# Patient Record
Sex: Female | Born: 1940 | Race: White | Hispanic: No | Marital: Married | Smoking: Never smoker
Health system: Southern US, Community
[De-identification: ages and names within clinical notes are randomized; demographics above are authoritative.]

## PROBLEM LIST (undated history)

## (undated) DIAGNOSIS — E119 Type 2 diabetes mellitus without complications: Secondary | ICD-10-CM

## (undated) DIAGNOSIS — I10 Essential (primary) hypertension: Secondary | ICD-10-CM

## (undated) DIAGNOSIS — I4891 Unspecified atrial fibrillation: Secondary | ICD-10-CM

## (undated) HISTORY — PX: EYE SURGERY: SHX253

---

## 2011-08-18 DIAGNOSIS — H2510 Age-related nuclear cataract, unspecified eye: Secondary | ICD-10-CM | POA: Insufficient documentation

## 2011-08-18 DIAGNOSIS — Z961 Presence of intraocular lens: Secondary | ICD-10-CM | POA: Insufficient documentation

## 2011-09-22 DIAGNOSIS — Z9849 Cataract extraction status, unspecified eye: Secondary | ICD-10-CM | POA: Insufficient documentation

## 2012-03-01 ENCOUNTER — Emergency Department: Payer: Self-pay | Admitting: Internal Medicine

## 2012-03-07 ENCOUNTER — Ambulatory Visit: Payer: Self-pay | Admitting: Family Medicine

## 2012-08-01 ENCOUNTER — Ambulatory Visit: Payer: Self-pay | Admitting: Family Medicine

## 2012-08-01 LAB — COMPREHENSIVE METABOLIC PANEL
Albumin: 4.1 g/dL (ref 3.4–5.0)
Alkaline Phosphatase: 68 U/L (ref 50–136)
Anion Gap: 14 (ref 7–16)
BUN: 16 mg/dL (ref 7–18)
Co2: 27 mmol/L (ref 21–32)
EGFR (African American): >60
EGFR (Non-African Amer.): >60
Glucose: 116 mg/dL — ABNORMAL HIGH (ref 65–99)
Osmolality: 282 (ref 275–301)
Potassium: 3.6 mmol/L (ref 3.5–5.1)
SGOT(AST): 30 U/L (ref 15–37)
Sodium: 140 mmol/L (ref 136–145)
Total Protein: 7.9 g/dL (ref 6.4–8.2)

## 2012-08-01 LAB — URINALYSIS, COMPLETE
Bacteria: NEGATIVE
Bilirubin,UR: NEGATIVE
Glucose,UR: NEGATIVE mg/dL (ref 0–75)
Ketone: NEGATIVE
Leukocyte Esterase: NEGATIVE
Nitrite: NEGATIVE
Ph: 7 (ref 4.5–8.0)
Specific Gravity: 1.015 (ref 1.003–1.030)

## 2012-08-01 LAB — CBC WITH DIFFERENTIAL/PLATELET
Basophil #: 0.1 10*3/uL (ref 0.0–0.1)
Eosinophil #: 0 10*3/uL (ref 0.0–0.7)
Eosinophil %: 0.2 %
HGB: 14.3 g/dL (ref 12.0–16.0)
Lymphocyte #: 1.5 10*3/uL (ref 1.0–3.6)
Lymphocyte %: 17.9 %
MCHC: 33.1 g/dL (ref 32.0–36.0)
Neutrophil #: 6.1 10*3/uL (ref 1.4–6.5)
Neutrophil %: 72.1 %
Platelet: 255 10*3/uL (ref 150–440)
RBC: 5.27 10*6/uL — ABNORMAL HIGH (ref 3.80–5.20)
RDW: 16.2 % — ABNORMAL HIGH (ref 11.5–14.5)
WBC: 8.4 10*3/uL (ref 3.6–11.0)

## 2013-06-12 ENCOUNTER — Ambulatory Visit: Payer: Self-pay | Admitting: Emergency Medicine

## 2013-11-08 ENCOUNTER — Ambulatory Visit: Payer: Self-pay | Admitting: Emergency Medicine

## 2014-01-17 ENCOUNTER — Ambulatory Visit: Payer: Self-pay | Admitting: Internal Medicine

## 2014-02-04 ENCOUNTER — Ambulatory Visit: Payer: Self-pay | Admitting: Physician Assistant

## 2014-04-22 DIAGNOSIS — M19049 Primary osteoarthritis, unspecified hand: Secondary | ICD-10-CM | POA: Insufficient documentation

## 2014-08-07 DIAGNOSIS — R251 Tremor, unspecified: Secondary | ICD-10-CM | POA: Insufficient documentation

## 2014-08-07 DIAGNOSIS — I4891 Unspecified atrial fibrillation: Secondary | ICD-10-CM | POA: Insufficient documentation

## 2014-08-07 DIAGNOSIS — Z7901 Long term (current) use of anticoagulants: Secondary | ICD-10-CM | POA: Insufficient documentation

## 2014-08-07 DIAGNOSIS — I10 Essential (primary) hypertension: Secondary | ICD-10-CM | POA: Insufficient documentation

## 2014-12-18 DIAGNOSIS — Z5181 Encounter for therapeutic drug level monitoring: Secondary | ICD-10-CM | POA: Insufficient documentation

## 2014-12-26 DIAGNOSIS — G250 Essential tremor: Secondary | ICD-10-CM | POA: Insufficient documentation

## 2015-04-03 DIAGNOSIS — M25551 Pain in right hip: Secondary | ICD-10-CM | POA: Insufficient documentation

## 2015-07-16 DIAGNOSIS — R7301 Impaired fasting glucose: Secondary | ICD-10-CM | POA: Insufficient documentation

## 2015-08-08 DIAGNOSIS — D122 Benign neoplasm of ascending colon: Secondary | ICD-10-CM | POA: Insufficient documentation

## 2015-08-13 DIAGNOSIS — R791 Abnormal coagulation profile: Secondary | ICD-10-CM | POA: Insufficient documentation

## 2015-12-01 IMAGING — CR DG CHEST 2V
3 series · 3 of 3 positions shown · non-contrast
Comparison: Chest x-ray of 03/18/2009

CLINICAL DATA: Cough for 1 week, fever, chest tightness

EXAM:
CHEST  2 VIEW

[chest pa (1 of 2)]
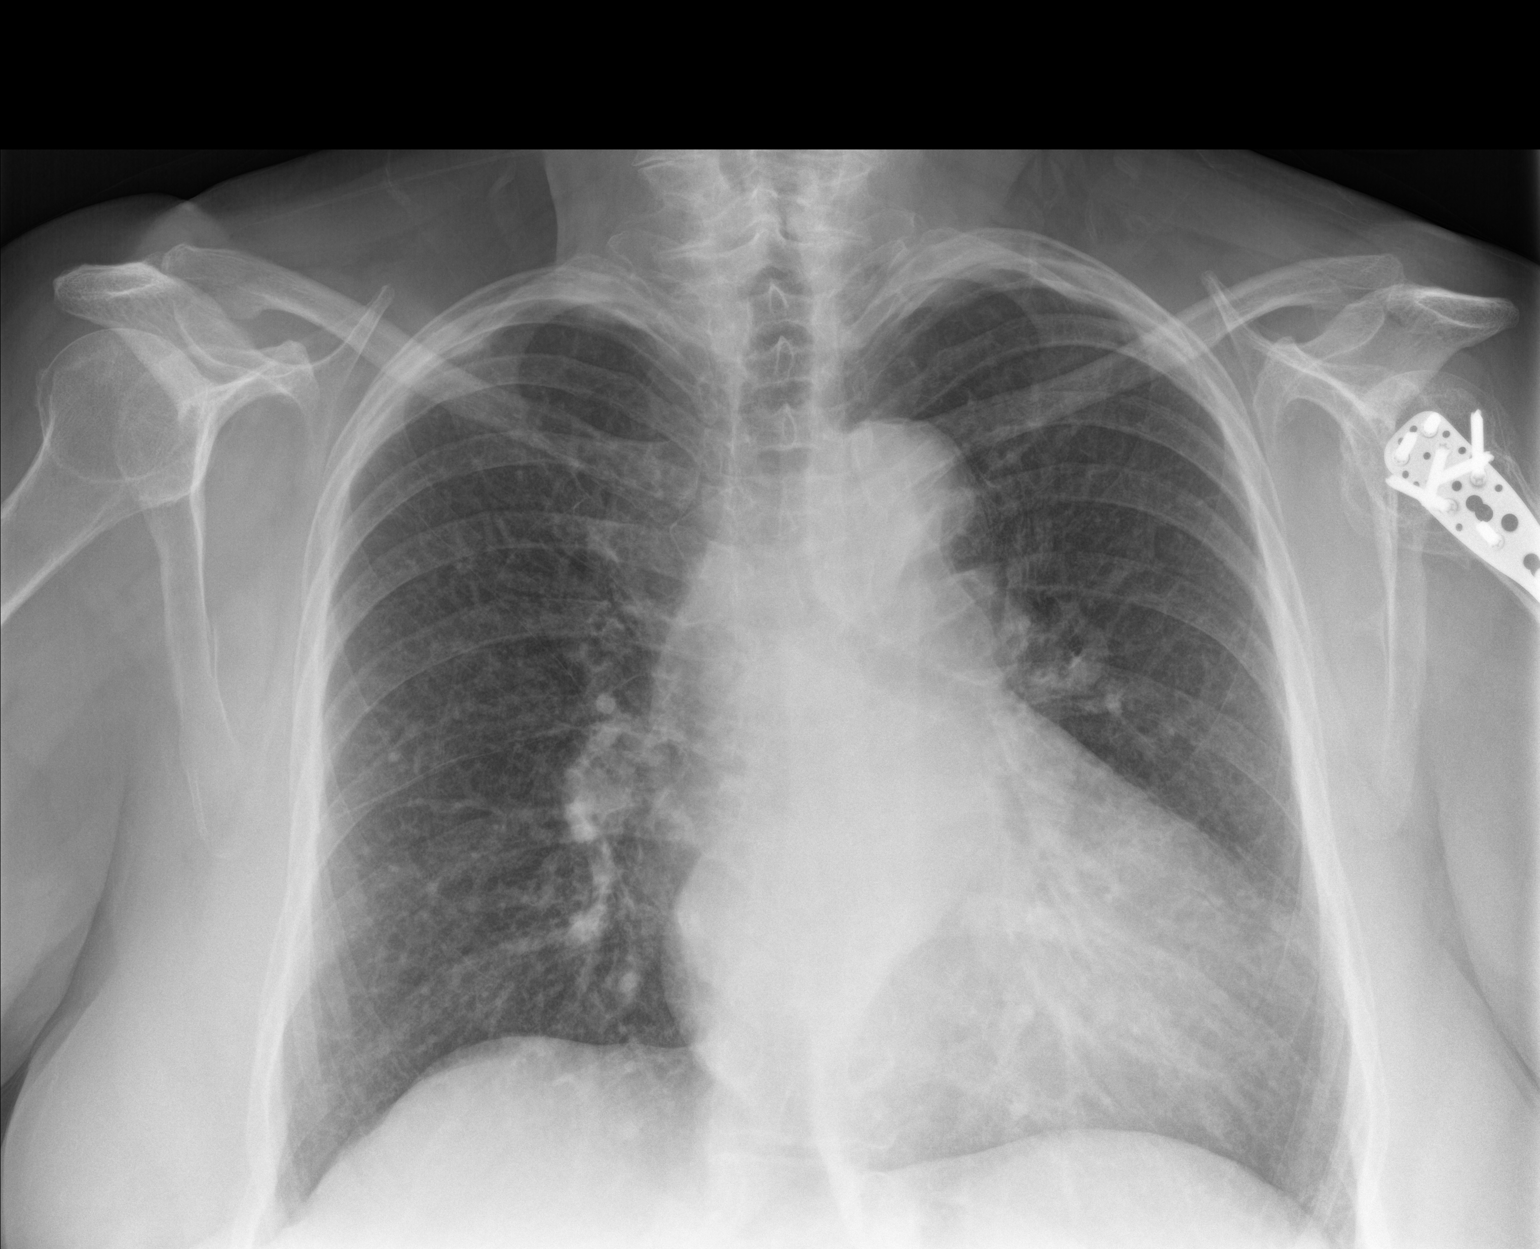

[chest pa (2 of 2)]
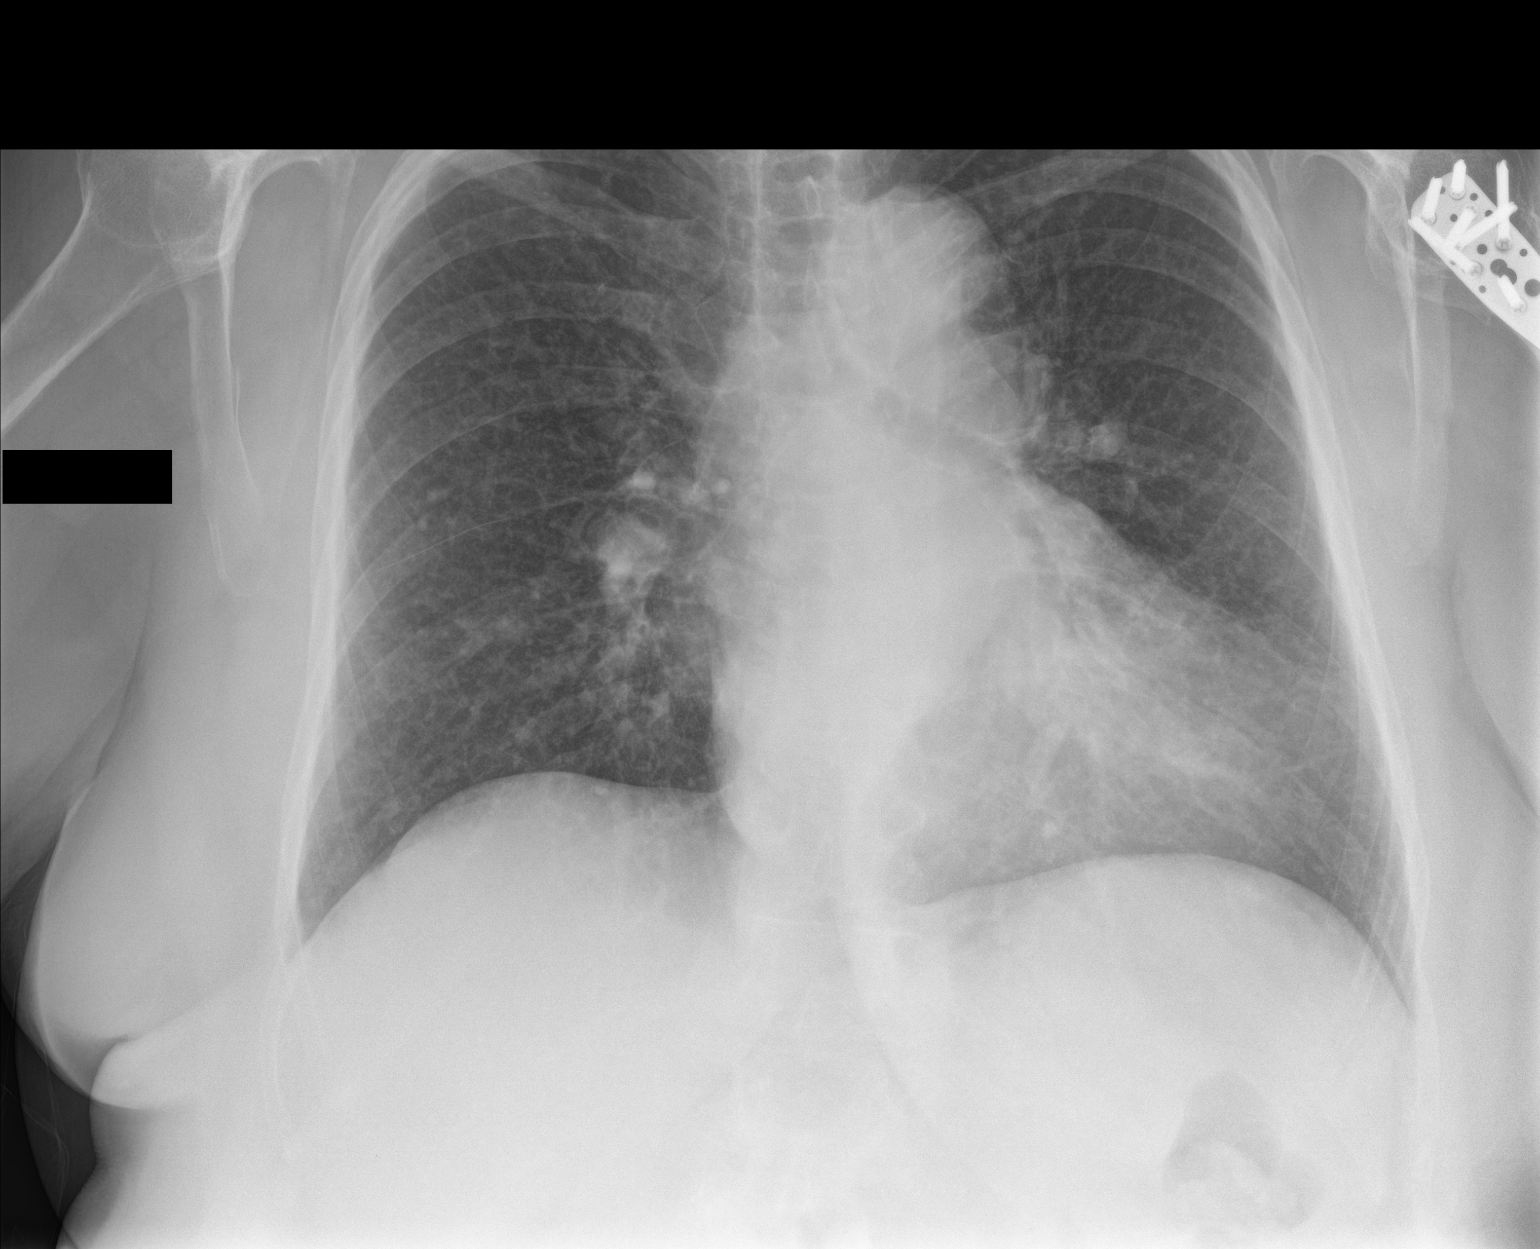

[chest lat]
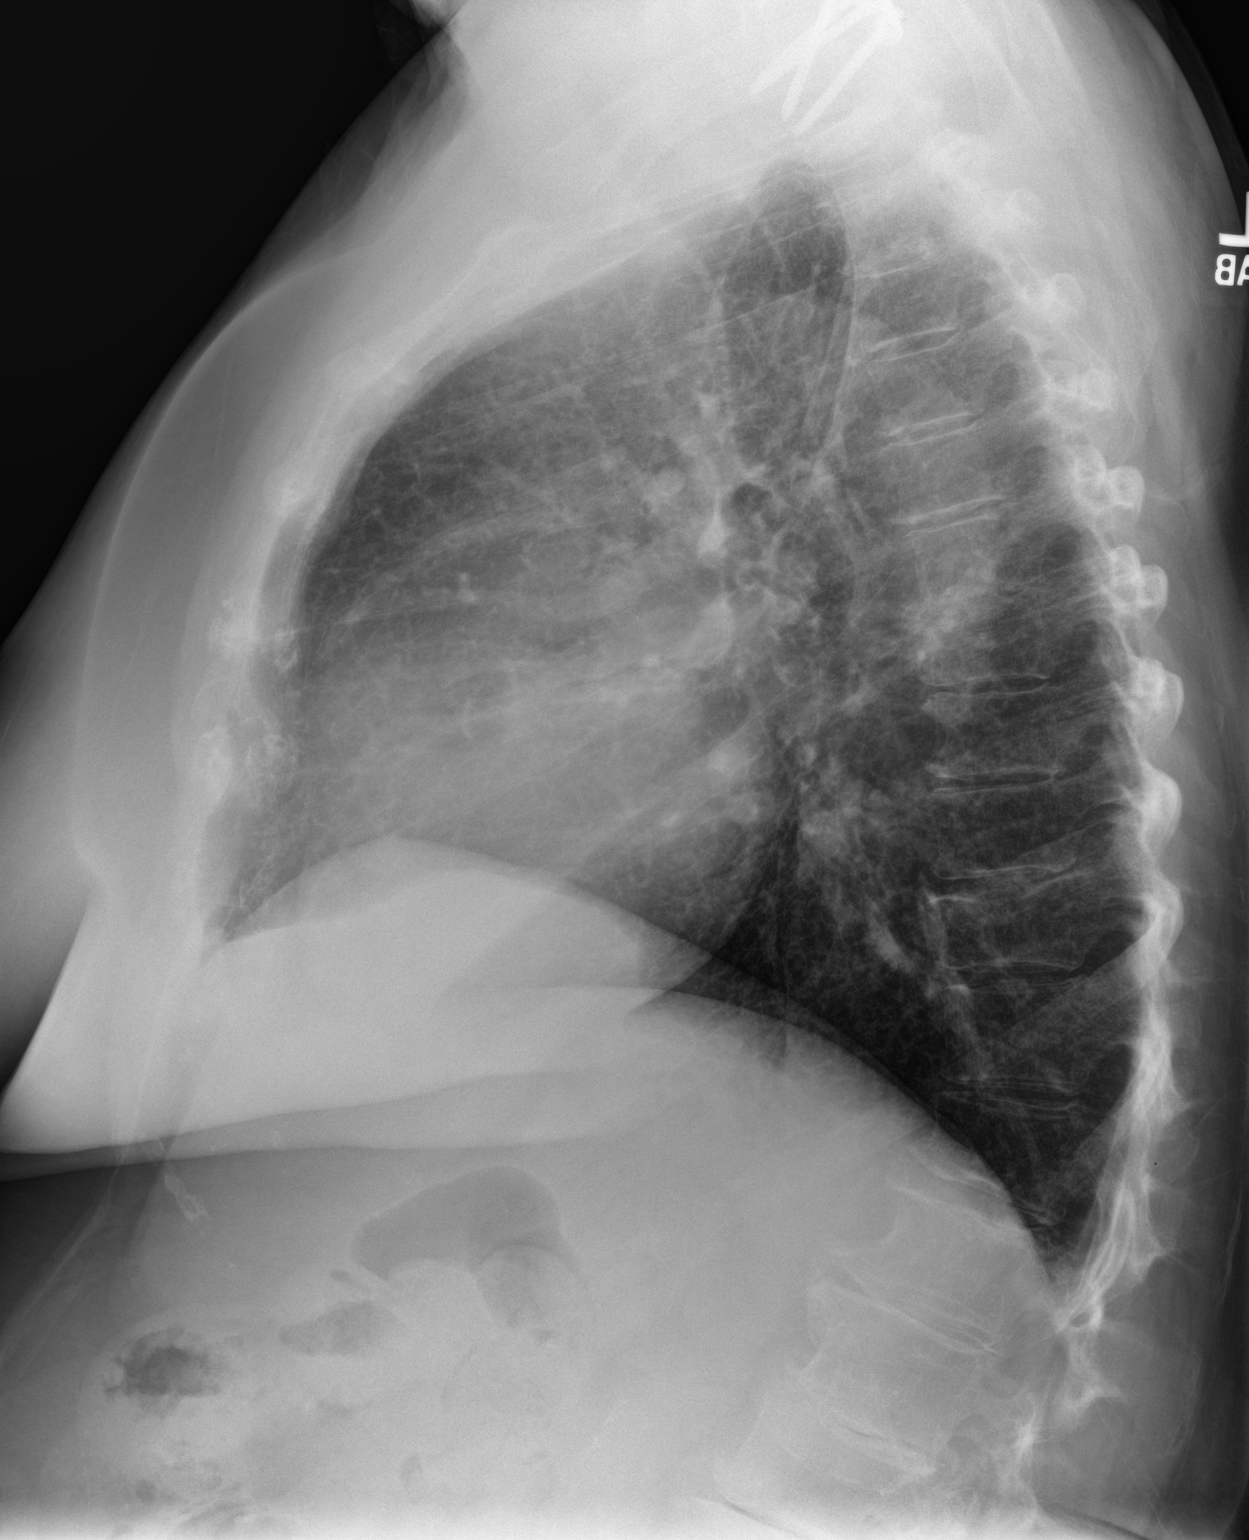

[3 of 3 positions shown; findings below may reference images not displayed]

FINDINGS: There are somewhat prominent interstitial markings diffusely which
are relatively stable. However no definite infiltrate or effusion is
seen. The heart is mildly enlarged and stable. Fixation plate and
screws over the left humeral head neck are noted.
IMPRESSION: Little change in prominent interstitial markings diffusely most
likely chronic in nature. Stable mild cardiomegaly.

## 2015-12-02 DIAGNOSIS — I422 Other hypertrophic cardiomyopathy: Secondary | ICD-10-CM | POA: Insufficient documentation

## 2015-12-02 DIAGNOSIS — G4733 Obstructive sleep apnea (adult) (pediatric): Secondary | ICD-10-CM | POA: Insufficient documentation

## 2016-02-27 IMAGING — CR DG CHEST 2V
2 series · 2 of 2 positions shown · non-contrast
Comparison: 11/08/2013 and 03/18/2009

CLINICAL DATA: Productive cough.  Shortness of breath.

EXAM:
CHEST  2 VIEW

[chest pa]
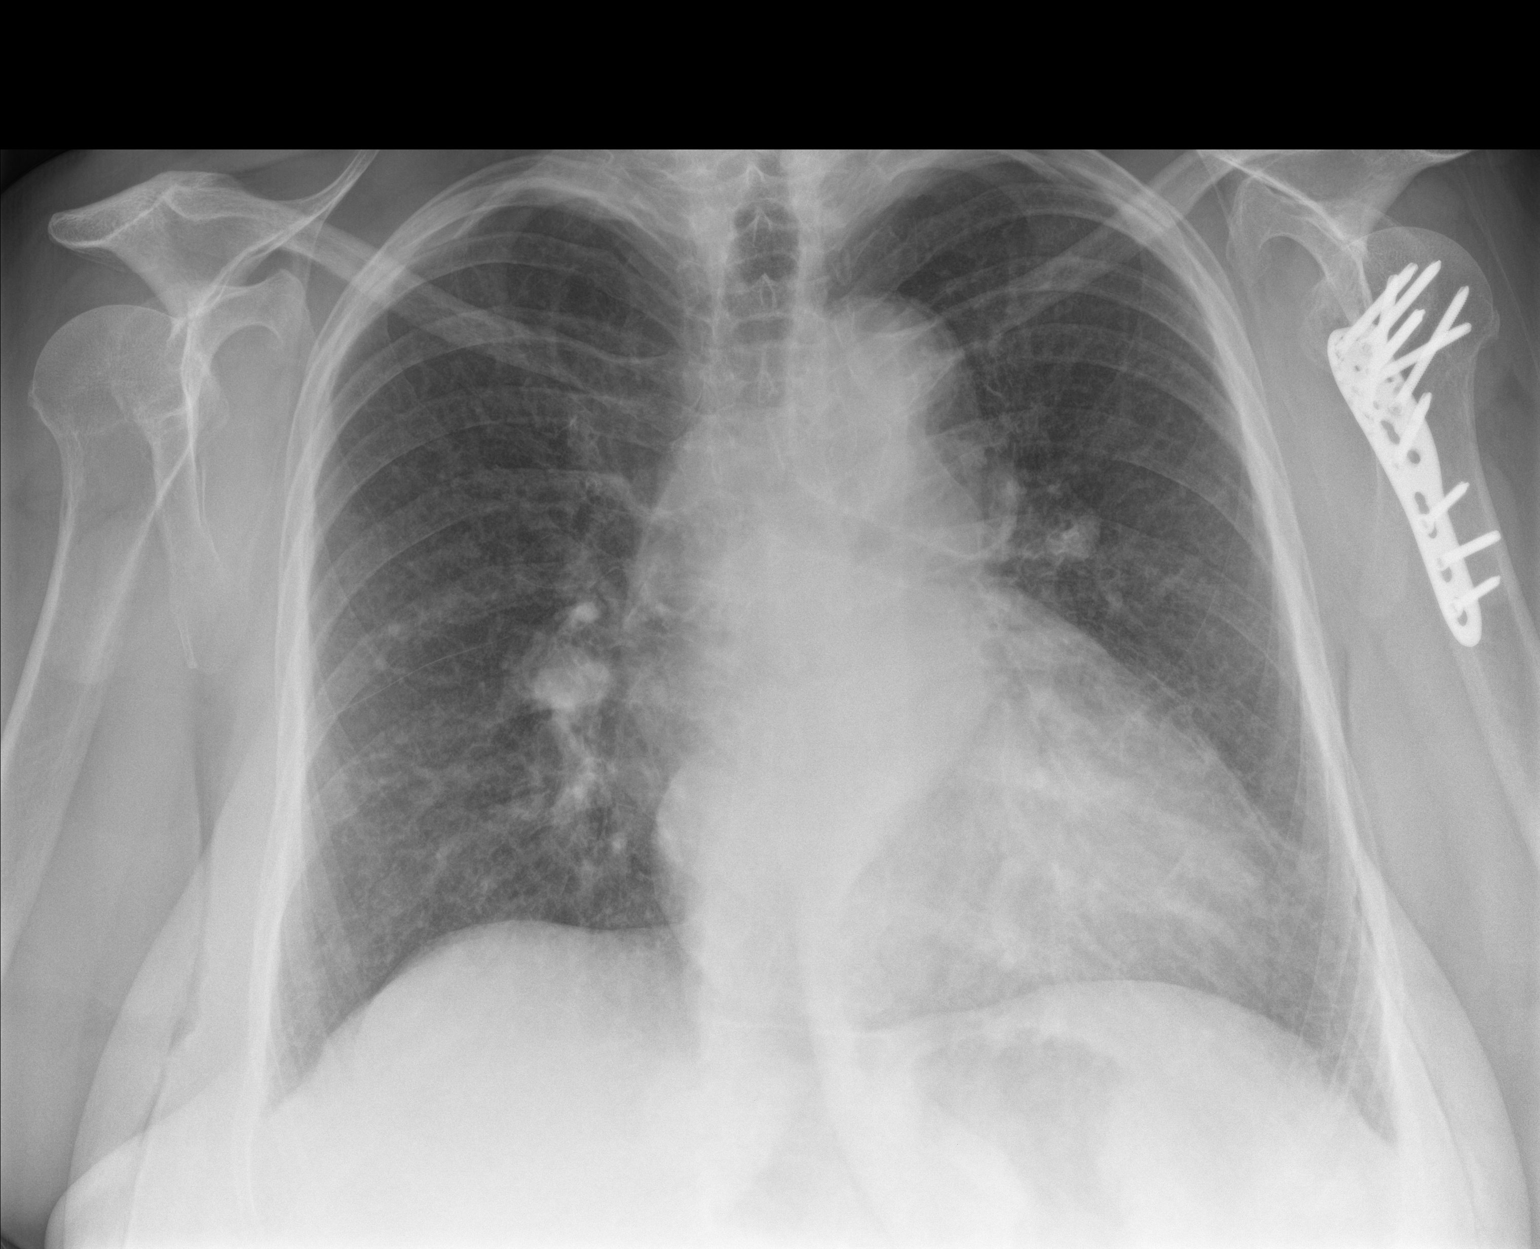

[chest lat]
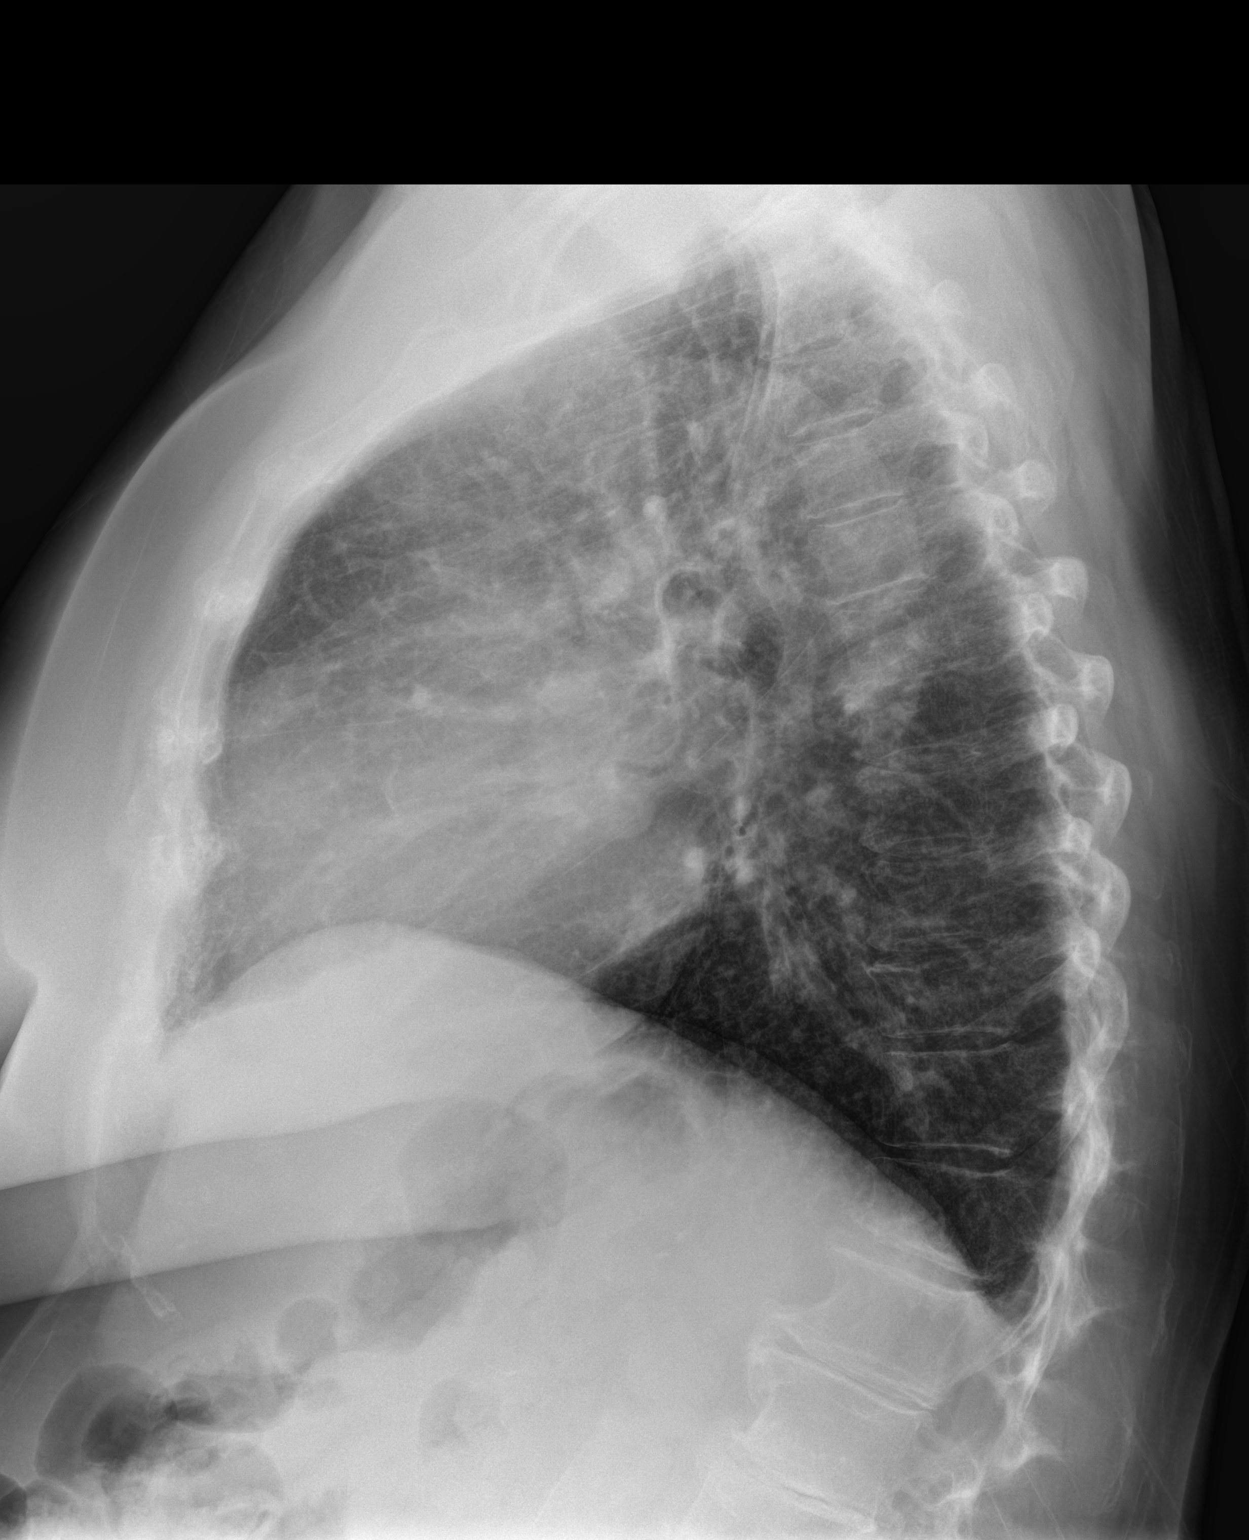

[2 of 2 positions shown; findings below may reference images not displayed]

FINDINGS: There is borderline cardiomegaly with tortuosity of the thoracic
aorta. The pulmonary vascularity is within normal limits. There is
diffuse chronic accentuation of the interstitial markings. No
effusions. No acute osseous abnormality.
IMPRESSION: No acute abnormality. Chronic lung disease. Borderline cardiomegaly.

## 2016-09-05 ENCOUNTER — Emergency Department: Payer: Medicare Other

## 2016-09-05 ENCOUNTER — Emergency Department
Admission: EM | Admit: 2016-09-05 | Discharge: 2016-09-05 | Disposition: A | Discharge status: Home / Self Care | Payer: Medicare Other | Attending: Emergency Medicine | Admitting: Emergency Medicine

## 2016-09-05 DIAGNOSIS — Y999 Unspecified external cause status: Secondary | ICD-10-CM | POA: Diagnosis not present

## 2016-09-05 DIAGNOSIS — X509XXA Other and unspecified overexertion or strenuous movements or postures, initial encounter: Secondary | ICD-10-CM | POA: Diagnosis not present

## 2016-09-05 DIAGNOSIS — E119 Type 2 diabetes mellitus without complications: Secondary | ICD-10-CM | POA: Diagnosis not present

## 2016-09-05 DIAGNOSIS — Y9389 Activity, other specified: Secondary | ICD-10-CM | POA: Insufficient documentation

## 2016-09-05 DIAGNOSIS — M1990 Unspecified osteoarthritis, unspecified site: Secondary | ICD-10-CM | POA: Diagnosis not present

## 2016-09-05 DIAGNOSIS — T148XXA Other injury of unspecified body region, initial encounter: Secondary | ICD-10-CM | POA: Insufficient documentation

## 2016-09-05 DIAGNOSIS — Y9273 Farm field as the place of occurrence of the external cause: Secondary | ICD-10-CM | POA: Diagnosis not present

## 2016-09-05 DIAGNOSIS — M542 Cervicalgia: Secondary | ICD-10-CM | POA: Diagnosis present

## 2016-09-05 DIAGNOSIS — I10 Essential (primary) hypertension: Secondary | ICD-10-CM | POA: Diagnosis not present

## 2016-09-05 HISTORY — DX: Type 2 diabetes mellitus without complications: E11.9

## 2016-09-05 HISTORY — DX: Unspecified atrial fibrillation: I48.91

## 2016-09-05 HISTORY — DX: Essential (primary) hypertension: I10

## 2016-09-05 MED ORDER — LIDOCAINE 5 % EX PTCH
1.0000 | MEDICATED_PATCH | CUTANEOUS | Status: DC
  Administered 2016-09-05: 1 via TRANSDERMAL
  Filled 2016-09-05: qty 1

## 2016-09-05 MED ORDER — TRAMADOL HCL 50 MG PO TABS
50.0000 mg | ORAL_TABLET | Freq: Once | ORAL | Status: AC
  Administered 2016-09-05: 50 mg via ORAL
  Filled 2016-09-05: qty 1

## 2016-09-05 MED ORDER — LIDOCAINE 5 % EX PTCH: 1.0000 | MEDICATED_PATCH | CUTANEOUS | 0 refills | Status: AC

## 2016-09-05 MED ORDER — CYCLOBENZAPRINE HCL 5 MG PO TABS: 5.0000 mg | ORAL_TABLET | Freq: Two times a day (BID) | ORAL | 0 refills | Status: AC

## 2016-09-05 NOTE — ED Provider Notes (Signed)
[facility] Emergency Department Provider Note  ____________________________________________  Time seen: Approximately 5:09 PM  I have reviewed the triage vital signs and the nursing notes.   HISTORY  Chief Complaint neck pain    HPI Lydia Soto is a 76 y.o. female that presents to the emergency department with neck pain after mowing 7 acres yesterday. Pain started today. It is worse on the right than on the left. It does not radiate.  She took a half of a muscle relaxer before arriving. She also placed some lidocaine gel on the back of her neck. No trauma. No headache, shortness of breath, chest pain, numbness, tingling.   Past Medical History:  Diagnosis Date  . A-fib ([place])   . Diabetes mellitus without complication ([place])   . Hypertension     There are no active problems to display for this patient.   History reviewed. No pertinent surgical history.  Prior to Admission medications   Medication Sig Start Date End Date Taking? Authorizing Provider  cyclobenzaprine (FLEXERIL) 5 MG tablet Take 1 tablet (5 mg total) by mouth 2 (two) times daily. 09/05/16 09/12/16  Laban Emperor, PA-C  lidocaine (LIDODERM) 5 % Place 1 patch onto the skin daily. Remove & Discard patch within 12 hours or as directed by MD 09/05/16 09/05/17  Laban Emperor, PA-C    Allergies Phenobarbital and Sulfur  No family history on file.  Social History Social History  Substance Use Topics  . Smoking status: Never Smoker  . Smokeless tobacco: Not on file  . Alcohol use No     Review of Systems  Cardiovascular: No chest pain. Respiratory: No SOB. Gastrointestinal: No abdominal pain.  No nausea, no vomiting.  Musculoskeletal: Positive for neck pain. Skin: Negative for rash, abrasions, lacerations, ecchymosis. Neurological: Negative for headaches, numbness or tingling   ____________________________________________   PHYSICAL EXAM:  VITAL SIGNS: ED Triage Vitals  Enc  Vitals Group     BP 09/05/16 1543 (!) 163/111     Pulse Rate 09/05/16 1543 81     Resp 09/05/16 1543 18     Temp 09/05/16 1543 99.4 F (37.4 C)     Temp Source 09/05/16 1543 Oral     SpO2 09/05/16 1543 94 %     Weight 09/05/16 1543 220 lb (99.8 kg)     Height 09/05/16 1543 5\' 4"  (1.626 m)     Head Circumference --      Peak Flow --      Pain Score 09/05/16 1542 8     Pain Loc --      Pain Edu? --      Excl. in [place]? --      Constitutional: Alert and oriented. Well appearing and in no acute distress. Eyes: Conjunctivae are normal. PERRL. EOMI. Head: Atraumatic. ENT:      Ears:      Nose: No congestion/rhinnorhea.      Mouth/Throat: Mucous membranes are moist.  Neck: No stridor. Tenderness to palpation over inferior cervical spine. Tenderness to palpation over trapezius. Pain worse on the right than the left. Limited range of motion of neck. Cardiovascular: Normal rate, regular rhythm.  Good peripheral circulation. Respiratory: Normal respiratory effort without tachypnea or retractions. Lungs CTAB. Good air entry to the bases with no decreased or absent breath sounds. Musculoskeletal: Full range of motion to all extremities. No gross deformities appreciated. Neurologic:  Normal speech and language. No gross focal neurologic deficits are appreciated.  Skin:  Skin is warm, dry  and intact. No rash noted.   ____________________________________________   LABS (all labs ordered are listed, but only abnormal results are displayed)  Labs Reviewed - No data to display ____________________________________________  EKG   ____________________________________________  RADIOLOGY Robinette Haines, personally viewed and evaluated these images (plain radiographs) as part of my medical decision making, as well as reviewing the written report by the radiologist.  Dg Cervical Spine 2-3 Views  Result Date: 09/05/2016 CLINICAL DATA:  Severe neck pain. EXAM: CERVICAL SPINE - 2-3 VIEW  COMPARISON:  None. FINDINGS: No definite evidence of acute fracture. Osteopenia. 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 These include disc space narrowing, endplate sclerosis, and osteophyte formation. Vertebral body remodeling with ankylosing changes seen at C4-C5. Mild anterolisthesis of C5 on C6, likely degenerative. IMPRESSION: No definite evidence of acute fracture. Severe multilevel osteoarthritic changes. Electronically Signed   By: Fidela Salisbury M.D.   On: 09/05/2016 17:49    ____________________________________________    PROCEDURES  Procedure(s) performed:    Procedures    Medications  traMADol (ULTRAM) tablet 50 mg (50 mg Oral Given 09/05/16 1804)     ____________________________________________   INITIAL IMPRESSION / ASSESSMENT AND PLAN / ED COURSE  Pertinent labs & imaging results that were available during my care of the patient were reviewed by me and considered in my medical decision making (see chart for details).  Review of the [place] CSRS was performed in accordance of the [place] prior to dispensing any controlled drugs.   Patient presented to the emergency department for evaluation of neck pain. Vital signs and exam are reassuring. X-ray negative for acute bony abnormalities. Pain is likely musculoskeletal. After tramadol and Lidoderm, range of motion and pain improved. Patient will be discharged home with prescriptions for a low dose of Flexeril and Lidoderm patches. Patient is to follow up with PCP as directed. Patient is given ED precautions to return to the ED for any worsening or new symptoms.     ____________________________________________  FINAL CLINICAL IMPRESSION(S) / ED DIAGNOSES  Final diagnoses:  Osteoarthritis, unspecified osteoarthritis type, unspecified site  Muscle strain      NEW MEDICATIONS STARTED DURING THIS VISIT:  Discharge Medication List as of 09/05/2016   6:50 PM    START taking these medications   Details  cyclobenzaprine (FLEXERIL) 5 MG tablet Take 1 tablet (5 mg total) by mouth 2 (two) times daily., Starting Sun 09/05/2016, Until Sun 09/12/2016, Print    lidocaine (LIDODERM) 5 % Place 1 patch onto the skin daily. Remove & Discard patch within 12 hours or as directed by MD, Starting Sun 09/05/2016, Until Mon 09/05/2017, Print            This chart was dictated using voice recognition software/Dragon. Despite best efforts to proofread, errors can occur which can change the meaning. Any change was purely unintentional.    Laban Emperor, PA-C 09/05/16 2330    Arta Silence, MD 09/06/16 [phone]

## 2016-09-05 NOTE — ED Triage Notes (Signed)
Pt reports that she mowed over 7 acres yesterday and now is having neck pain. No known injury. Placed in c collar upon arrival. Pt ambulates with ease.

## 2016-11-02 DIAGNOSIS — F418 Other specified anxiety disorders: Secondary | ICD-10-CM

## 2017-01-10 ENCOUNTER — Ambulatory Visit
Admission: EM | Admit: 2017-01-10 | Discharge: 2017-01-10 | Disposition: A | Discharge status: Home / Self Care | Payer: Medicare Other | Attending: Family Medicine | Admitting: Family Medicine

## 2017-01-10 DIAGNOSIS — H6123 Impacted cerumen, bilateral: Secondary | ICD-10-CM

## 2017-01-10 DIAGNOSIS — M47812 Spondylosis without myelopathy or radiculopathy, cervical region: Secondary | ICD-10-CM | POA: Diagnosis not present

## 2017-01-10 MED ORDER — PREDNISONE 10 MG PO TABS: ORAL_TABLET | ORAL | 0 refills | Status: AC

## 2017-01-10 MED ORDER — TIZANIDINE HCL 2 MG PO CAPS: 2.0000 mg | ORAL_CAPSULE | Freq: Three times a day (TID) | ORAL | 0 refills | Status: AC

## 2017-01-10 NOTE — ED Provider Notes (Signed)
[facility]    CSN: [id number] Arrival date & time: 01/10/17  0803     History   Chief Complaint Chief Complaint  Patient presents with  . Neck Pain  . Otalgia    HPI Lydia Soto is a 76 y.o. female.   HPI  This is a 76 year old female who presents with left ear pain for 3 weeks has bled and is sore to the touch around the back of the ear.  She is also has neck pain with radiation into both upper extremities involving the ring and little fingers initially but now with persistent tingling of her right little finger.  She has been seen at the emergency room at [facility] where x-rays showed severe spondylitis of the cervical spine.  She has great deal of anxiety which she is states has exacerbating effect on the neck.  At the present time she has limited range of motion and "knots" in the back of her neck that are very tender.  She has been taking a muscle relaxers which sounds like Flexeril the she did not bring her medicines with her.  Is a history of chronic atrial fibrillation and is on warfarin therapy.        Past Medical History:  Diagnosis Date  . A-fib ([place])   . Diabetes mellitus without complication ([place])   . Hypertension     There are no active problems to display for this patient.   History reviewed. No pertinent surgical history.  OB History    No data available       Home Medications    Prior to Admission medications   Medication Sig Start Date End Date Taking? Authorizing Provider  ALPRAZolam Duanne Moron) 0.5 MG tablet Take 0.5 mg by mouth at bedtime as needed for anxiety.   Yes [provider]  FLUoxetine (PROZAC) 20 MG tablet Take 20 mg by mouth daily.   Yes [provider]  hydrochlorothiazide (HYDRODIURIL) 25 MG tablet Take 25 mg by mouth daily.   Yes [provider]  metoprolol tartrate (LOPRESSOR) 50 MG tablet Take 50 mg by mouth 2 (two) times daily.   Yes [provider]  Multiple Vitamin (MULTIVITAMIN)  tablet Take 1 tablet by mouth daily.   Yes [provider]  pantoprazole (PROTONIX) 40 MG tablet Take 40 mg by mouth daily.   Yes [provider]  potassium chloride (K-DUR) 10 MEQ tablet Take 10 mEq by mouth daily.   Yes [provider]  warfarin (COUMADIN) 5 MG tablet Take 5 mg by mouth daily.   Yes [provider]  lidocaine (LIDODERM) 5 % Place 1 patch onto the skin daily. Remove & Discard patch within 12 hours or as directed by MD 09/05/16 09/05/17  Laban Emperor, PA-C  predniSONE (DELTASONE) 10 MG tablet Take 2 tablets on day 1 and 1 tablet daily for the next 4 days 01/10/17   Lorin Picket, PA-C  tizanidine (ZANAFLEX) 2 MG capsule Take 1 capsule (2 mg total) by mouth 3 (three) times daily. 01/10/17   Lorin Picket, PA-C    Family History No family history on file.  Social History Social History   Tobacco Use  . Smoking status: Never Smoker  . Smokeless tobacco: Never Used  Substance Use Topics  . Alcohol use: No  . Drug use: Not on file     Allergies   Phenobarbital and Sulfur   Review of Systems Review of Systems  Constitutional: Positive for activity change. Negative  for chills, fatigue and fever.  Musculoskeletal: Positive for myalgias, neck pain and neck stiffness.  All other systems reviewed and are negative.    Physical Exam Triage Vital Signs ED Triage Vitals  Enc Vitals Group     BP 01/10/17 0813 (!) 155/102     Pulse Rate 01/10/17 0813 80     Resp 01/10/17 0813 18     Temp 01/10/17 0813 98 F (36.7 C)     Temp Source 01/10/17 0813 Oral     SpO2 01/10/17 0813 95 %     Weight 01/10/17 0818 220 lb (99.8 kg)     Height --      Head Circumference --      Peak Flow --      Pain Score 01/10/17 0818 4     Pain Loc --      Pain Edu? --      Excl. in [place]? --    No data found.  Updated Vital Signs BP (!) 155/102 (BP Location: Left Arm)   Pulse 80   Temp 98 F (36.7 C) (Oral)   Resp 18   Wt 220 lb (99.8 kg)    SpO2 95%   BMI 37.76 kg/m   Visual Acuity Right Eye Distance:   Left Eye Distance:   Bilateral Distance:    Right Eye Near:   Left Eye Near:    Bilateral Near:     Physical Exam  Constitutional: She is oriented to person, place, and time. She appears well-developed and well-nourished. No distress.  HENT:  Head: Normocephalic.  Nose: Nose normal.  Mouth/Throat: Oropharynx is clear and moist. No oropharyngeal exudate.  Examination of the both ears shows occlusion with cerumen.  There is no discomfort over the mastoid or with the movement of the tragus.  There is no evidence of blood or discharge in the left ear.  Eyes: Pupils are equal, round, and reactive to light. Right eye exhibits no discharge. Left eye exhibits no discharge.  Neck: Normal range of motion. Neck supple.  Musculoskeletal: She exhibits tenderness.  Examination of the cervical spine shows limitation of motion in all planes.  Upper extremity strength and sensation are intact except for the right little finger showing hypnesthesia in comparison to the left.  Does have tenderness and palpable muscle spasm of the paraspinous muscles particularly in the lower segments in the trapezius  Lymphadenopathy:    She has no cervical adenopathy.  Neurological: She is alert and oriented to person, place, and time.  Skin: Skin is warm and dry. She is not diaphoretic.  Psychiatric: She has a normal mood and affect. Her behavior is normal. Judgment and thought content normal.  Nursing note and vitals reviewed.    UC Treatments / Results  Labs (all labs ordered are listed, but only abnormal results are displayed) Labs Reviewed - No data to display  EKG  EKG Interpretation None       Radiology No results found.  Procedures Procedures (including critical care time)  Medications Ordered in UC Medications - No data to display   Initial Impression / Assessment and Plan / UC Course  I have reviewed the triage vital  signs and the nursing notes.  Pertinent labs & imaging results that were available during my care of the patient were reviewed by me and considered in my medical decision making (see chart for details).     Plan: 1. Test/x-ray results and diagnosis reviewed with patient 2. rx as per orders; risks,  benefits, potential side effects reviewed with patient 3. Recommend supportive treatment with rest and symptom avoidance.  Difficult in treating the patient due to her warfarin therapy.  According to her explanation , her INR is difficult to control. she has significant pain today.  Therefore I will place her on a short course of low-dose prednisone also recommended topical medication to help with her pain and help relieve spasm.  I will send her back to her primary care physician for further evaluation and possible referral to spinal surgeon for their opinion.  Also while at the primary care's office irrigation for her ears for the impaction will provide her with some benefit.  Cautioned her regarding the use of the muscle relaxer with her history of falls.  Provide her with a very low dose still caution her about its use. 4. F/u prn if symptoms worsen or don't improve   Final Clinical Impressions(s) / UC Diagnoses   Final diagnoses:  Cervical spondylosis without myelopathy  Bilateral impacted cerumen    ED Discharge Orders        Ordered    tizanidine (ZANAFLEX) 2 MG capsule  3 times daily     01/10/17 0855    predniSONE (DELTASONE) 10 MG tablet     01/10/17 0855       Controlled Substance Prescriptions [place] Controlled Substance Registry consulted? Not Applicable   Lorin Picket, PA-C 01/10/17 0881

## 2017-01-10 NOTE — Discharge Instructions (Signed)
Contact the person managing your warfarin to notify them of the prednisone you are taking.  Extreme caution while taking the muscle relaxer Zanaflex.  Drive or perform activities requiring concentration or judgment.  The medication may cause unsteadiness in ambulation.  Recommend following up with your primary care physician regarding your severe cervical arthritis.

## 2017-01-10 NOTE — ED Triage Notes (Signed)
Pt here for left ear pain for 3 weeks and said it was bleeding and sore to the touch around the back of her ear. Also has neck pain and was told she has a hx of arthritis in her neck and said she is unable to turn her head. Also states something may be in her ear that's what it feels like. Has been taking her muscle relaxer for this which she got at [facility] but ran out and unsure the name of it and isn't on the med list.

## 2018-03-06 DIAGNOSIS — M79675 Pain in left toe(s): Secondary | ICD-10-CM | POA: Insufficient documentation

## 2018-03-06 DIAGNOSIS — M2062 Acquired deformities of toe(s), unspecified, left foot: Secondary | ICD-10-CM | POA: Insufficient documentation

## 2018-04-07 DIAGNOSIS — H3562 Retinal hemorrhage, left eye: Secondary | ICD-10-CM | POA: Insufficient documentation

## 2018-09-07 DIAGNOSIS — H26493 Other secondary cataract, bilateral: Secondary | ICD-10-CM | POA: Insufficient documentation

## 2018-09-28 IMAGING — CR DG CERVICAL SPINE 2 OR 3 VIEWS
1 series · 6 of 6 positions shown · non-contrast
Comparison: None.

CLINICAL DATA: Severe neck pain.

EXAM:
CERVICAL SPINE - 2-3 VIEW

[Series 1: dg cervical spine 2 or 3 views · 0.14mm/px · 6 of 6 slices shown]
[im 1/6]
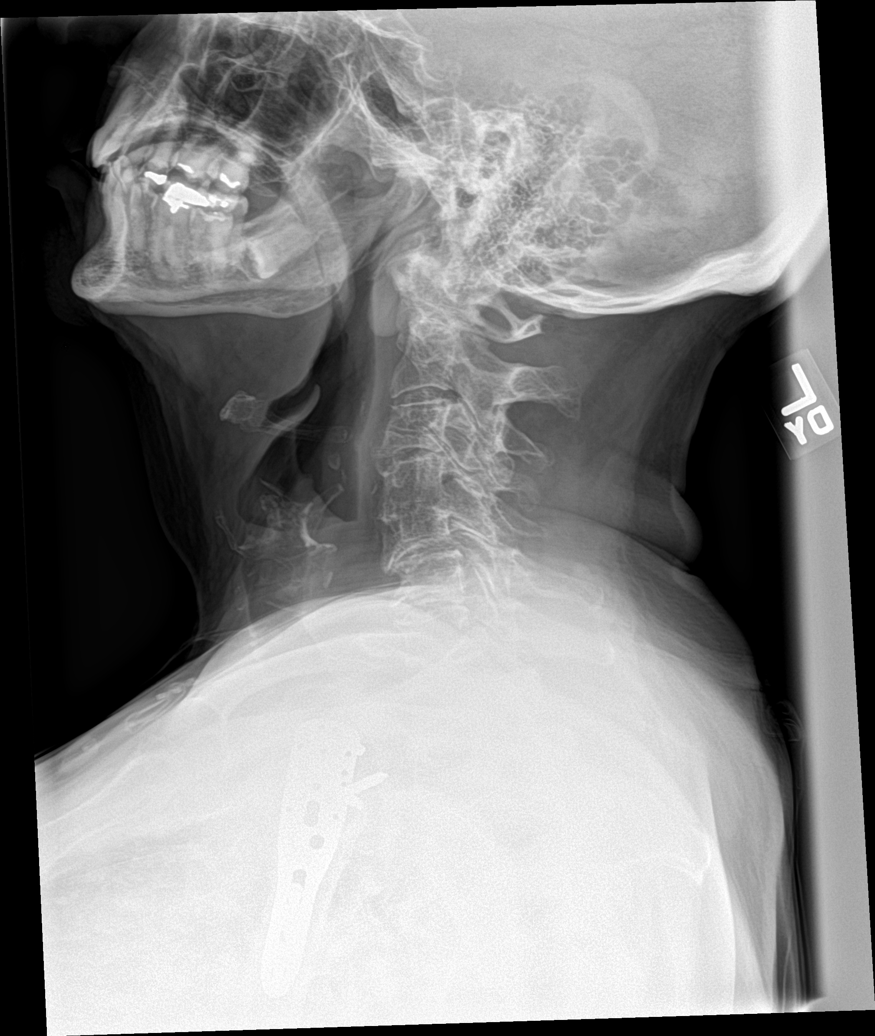
[im 2/6]
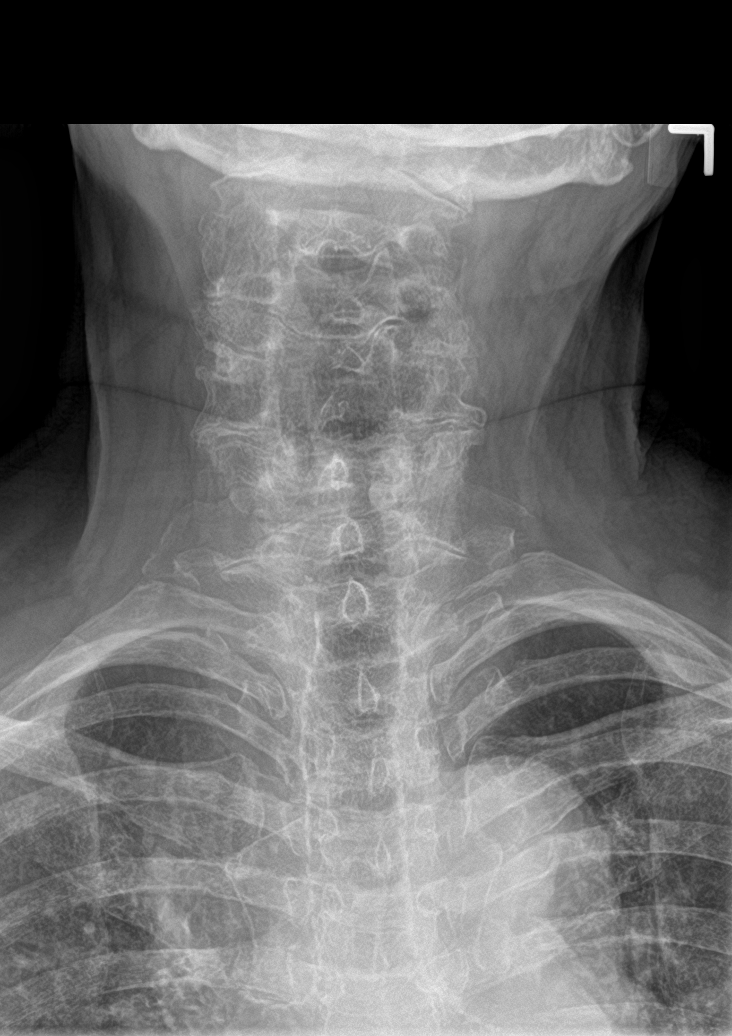
[im 3/6]
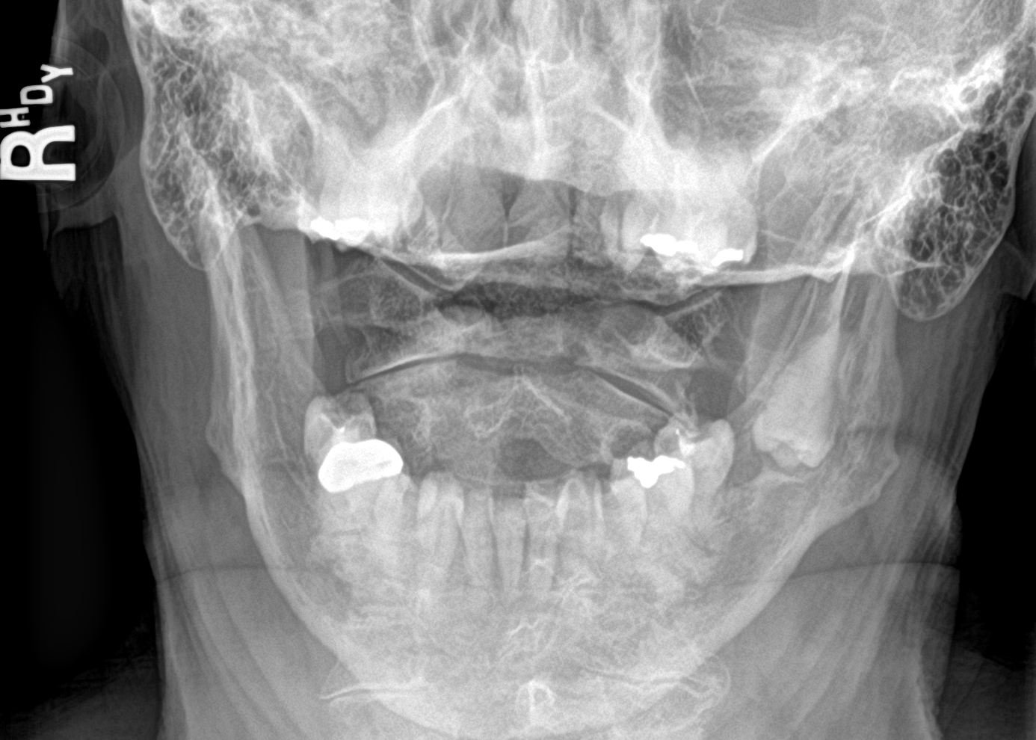
[im 4/6]
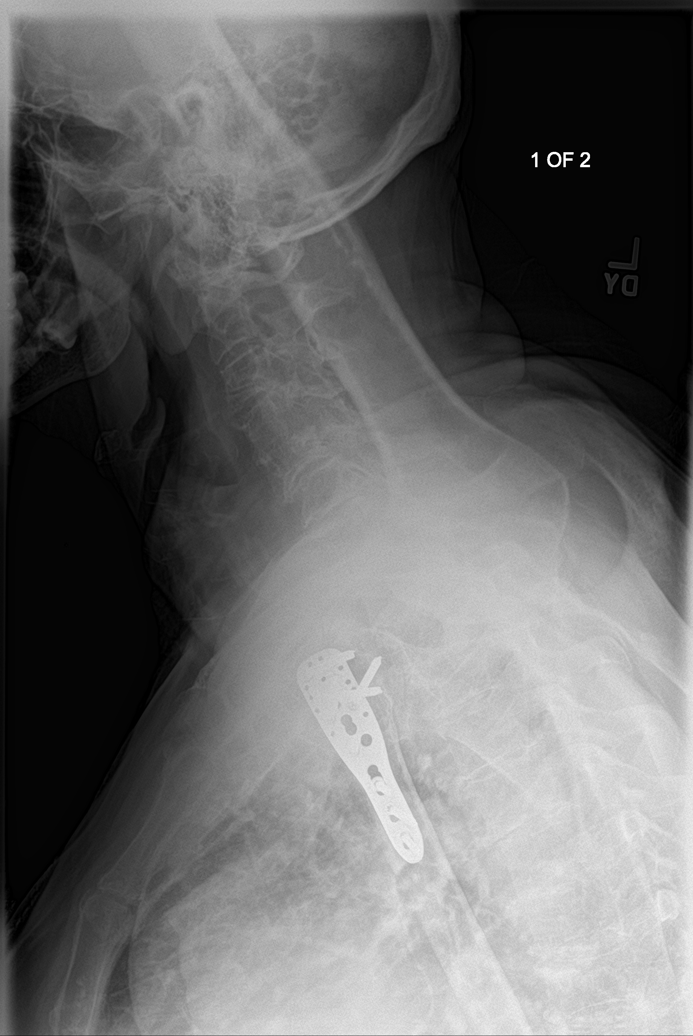
[im 5/6]
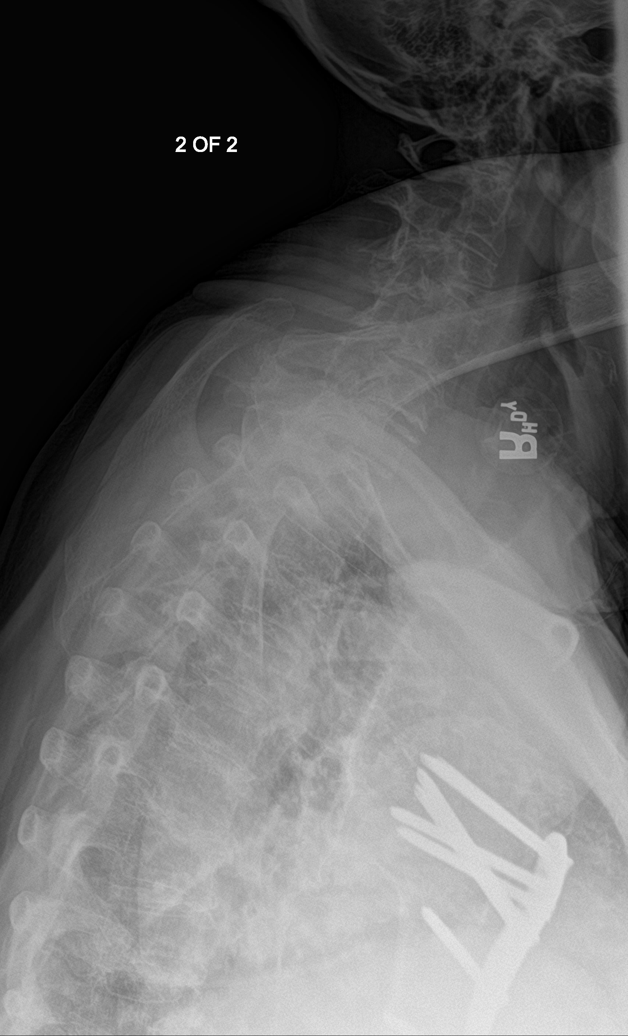
[im 6/6]
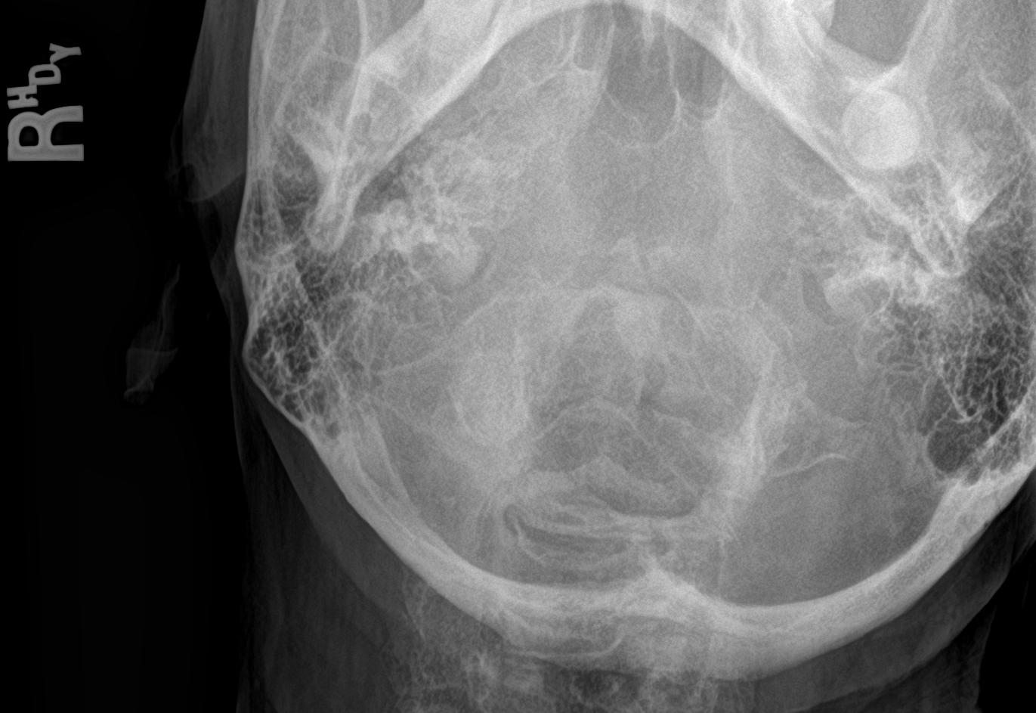

[6 of 6 positions shown; findings below may reference images not displayed]

FINDINGS: No definite evidence of acute fracture. Osteopenia. Severe
multilevel osteoarthritic changes of the cervical spine with
associated posterior facet arthropathy. These include disc space
narrowing, endplate sclerosis, and osteophyte formation. Vertebral
body remodeling with ankylosing changes seen at C4-C5. Mild
anterolisthesis of C5 on C6, likely degenerative.
IMPRESSION: No definite evidence of acute fracture.

Severe multilevel osteoarthritic changes.

## 2018-10-20 ENCOUNTER — Emergency Department
Admission: EM | Admit: 2018-10-20 | Discharge: 2018-10-20 | Disposition: A | Discharge status: Home / Self Care | Payer: Medicare Other | Attending: Emergency Medicine | Admitting: Emergency Medicine

## 2018-10-20 ENCOUNTER — Other Ambulatory Visit: Payer: Self-pay

## 2018-10-20 DIAGNOSIS — F4322 Adjustment disorder with anxiety: Secondary | ICD-10-CM

## 2018-10-20 DIAGNOSIS — Z79899 Other long term (current) drug therapy: Secondary | ICD-10-CM | POA: Diagnosis not present

## 2018-10-20 DIAGNOSIS — Z7901 Long term (current) use of anticoagulants: Secondary | ICD-10-CM | POA: Diagnosis not present

## 2018-10-20 DIAGNOSIS — F410 Panic disorder [episodic paroxysmal anxiety] without agoraphobia: Secondary | ICD-10-CM

## 2018-10-20 DIAGNOSIS — I10 Essential (primary) hypertension: Secondary | ICD-10-CM | POA: Diagnosis not present

## 2018-10-20 DIAGNOSIS — E119 Type 2 diabetes mellitus without complications: Secondary | ICD-10-CM | POA: Diagnosis not present

## 2018-10-20 DIAGNOSIS — F4320 Adjustment disorder, unspecified: Secondary | ICD-10-CM

## 2018-10-20 DIAGNOSIS — F411 Generalized anxiety disorder: Secondary | ICD-10-CM

## 2018-10-20 DIAGNOSIS — F418 Other specified anxiety disorders: Secondary | ICD-10-CM

## 2018-10-20 LAB — URINALYSIS, COMPLETE (UACMP) WITH MICROSCOPIC
Bilirubin Urine: NEGATIVE
Glucose, UA: NEGATIVE mg/dL
Ketones, ur: NEGATIVE mg/dL
Leukocytes,Ua: NEGATIVE
Nitrite: NEGATIVE
Protein, ur: NEGATIVE mg/dL
Specific Gravity, Urine: 1.002 — ABNORMAL LOW (ref 1.005–1.030)
pH: 6 (ref 5.0–8.0)

## 2018-10-20 LAB — BASIC METABOLIC PANEL
Anion gap: 11 (ref 5–15)
BUN: 10 mg/dL (ref 8–23)
CO2: 30 mmol/L (ref 22–32)
Calcium: 9.6 mg/dL (ref 8.9–10.3)
Chloride: 96 mmol/L — ABNORMAL LOW (ref 98–111)
Creatinine, Ser: 0.72 mg/dL (ref 0.44–1.00)
GFR calc Af Amer: >60 mL/min (ref >60)
GFR calc non Af Amer: >60 mL/min (ref >60)
Glucose, Bld: 122 mg/dL — ABNORMAL HIGH (ref 70–99)
Potassium: 2.8 mmol/L — ABNORMAL LOW (ref 3.5–5.1)
Sodium: 137 mmol/L (ref 135–145)

## 2018-10-20 LAB — CBC
HCT: 38.3 % (ref 36.0–46.0)
Hemoglobin: 12.4 g/dL (ref 12.0–15.0)
MCH: 26.6 pg (ref 26.0–34.0)
MCHC: 32.4 g/dL (ref 30.0–36.0)
MCV: 82.2 fL (ref 80.0–100.0)
Platelets: 265 10*3/uL (ref 150–400)
RBC: 4.66 MIL/uL (ref 3.87–5.11)
RDW: 17.2 % — ABNORMAL HIGH (ref 11.5–15.5)
WBC: 5.5 10*3/uL (ref 4.0–10.5)
nRBC: 0 % (ref 0.0–0.2)

## 2018-10-20 MED ORDER — DIAZEPAM 5 MG PO TABS
5.0000 mg | ORAL_TABLET | Freq: Once | ORAL | Status: AC
  Administered 2018-10-20: 5 mg via ORAL
  Filled 2018-10-20: qty 1

## 2018-10-20 MED ORDER — DIAZEPAM 5 MG PO TABS: 5.0000 mg | ORAL_TABLET | Freq: Three times a day (TID) | ORAL | 0 refills | Status: AC | PRN

## 2018-10-20 MED ORDER — POTASSIUM CHLORIDE CRYS ER 20 MEQ PO TBCR
40.0000 meq | EXTENDED_RELEASE_TABLET | Freq: Once | ORAL | Status: AC
  Administered 2018-10-20: 17:00:00 40 meq via ORAL
  Filled 2018-10-20: qty 2

## 2018-10-20 NOTE — ED Notes (Signed)
Pt given lunch tray and water  

## 2018-10-20 NOTE — ED Notes (Signed)
E-sig pad unavailable at this time, d/c instructions and paperwork given and explained to pt and brother, pt and brother voice understanding of teaching

## 2018-10-20 NOTE — ED Triage Notes (Addendum)
To ER from home c/o " I think I'm having a panic attack". C/o increased stress in her personal life-recent loss of husband and a lot of financial stressors. Pt reports tremors and episodes of shaking over the last month since husband died. Denies SI or HI.   Pt states that "I just need some peace and quiet". Daughters living in her home, reports "everything is just coming down at one time".

## 2018-10-20 NOTE — ED Notes (Signed)
Pt in interview room with psychiatry

## 2018-10-20 NOTE — ED Provider Notes (Signed)
[facility] Emergency Department Provider Note   ____________________________________________   First MD Initiated Contact with Patient 10/20/18 1317     (approximate)  I have reviewed the triage vital signs and the nursing notes.   HISTORY  Chief Complaint Anxiety    HPI Lydia Soto is a 78 y.o. female with a history of A. fib and diabetes  Reports that she has been suffering severe anxiety for about 1 month after the death of her husband  She denies suicidal or homicidal ideation, she denies any desire to hurt anyone else or herself.  Denies hallucinations.  Reports she is here for assistance as she is just extremely anxious all day despite taking Prozac and alprazolam which her doctor is giving her  She denies any recent illness no fevers or chills.  No pain or burning with urination.  She reports is been ongoing for a month and very severe since the death of her husband  Family is with her at the bedside.  Patient reports she has not been handling events well since the death of her family member   Past Medical History:  Diagnosis Date  . A-fib ([place])   . Diabetes mellitus without complication ([place])   . Hypertension     There are no active problems to display for this patient.   History reviewed. No pertinent surgical history.  Prior to Admission medications   Medication Sig Start Date End Date Taking? Authorizing Provider  ALPRAZolam Duanne Moron) 0.5 MG tablet Take 0.5 mg by mouth at bedtime as needed for anxiety.    [provider]  FLUoxetine (PROZAC) 20 MG tablet Take 20 mg by mouth daily.    [provider]  hydrochlorothiazide (HYDRODIURIL) 25 MG tablet Take 25 mg by mouth daily.    [provider]  metoprolol tartrate (LOPRESSOR) 50 MG tablet Take 50 mg by mouth 2 (two) times daily.    [provider]  Multiple Vitamin (MULTIVITAMIN) tablet Take 1 tablet by mouth daily.    [provider]   pantoprazole (PROTONIX) 40 MG tablet Take 40 mg by mouth daily.    [provider]  potassium chloride (K-DUR) 10 MEQ tablet Take 10 mEq by mouth daily.    [provider]  predniSONE (DELTASONE) 10 MG tablet Take 2 tablets on day 1 and 1 tablet daily for the next 4 days 01/10/17   Lorin Picket, PA-C  tizanidine (ZANAFLEX) 2 MG capsule Take 1 capsule (2 mg total) by mouth 3 (three) times daily. 01/10/17   Lorin Picket, PA-C  warfarin (COUMADIN) 5 MG tablet Take 5 mg by mouth daily.    [provider]    Allergies Phenobarbital and Sulfur  No family history on file.  Social History Social History   Tobacco Use  . Smoking status: Never Smoker  . Smokeless tobacco: Never Used  Substance Use Topics  . Alcohol use: No  . Drug use: Not on file    Review of Systems Constitutional: No fever/chills Eyes: No visual changes. Cardiovascular: Denies chest pain. Respiratory: Denies shortness of breath. Gastrointestinal: No abdominal pain.  She feels a little nauseated at times, but she reports that she ate half a sandwich here without issue.  She just feels like it is her nerves. Genitourinary: Negative for dysuria. Musculoskeletal: Negative for back pain.  She feels shaky in her hands Skin: Negative for rash. Neurological: Negative for headaches, areas of focal weakness or numbness.  Patient reports her nerves  are just at wits end.    ____________________________________________   PHYSICAL EXAM:  VITAL SIGNS: ED Triage Vitals [10/20/18 1141]  Enc Vitals Group     BP 120/66     Pulse Rate (!) 56     Resp (!) 22     Temp 98.8 F (37.1 C)     Temp Source Oral     SpO2 94 %     Weight 198 lb 6.6 oz (90 kg)     Height 5\' 4"  (1.626 m)     Head Circumference      Peak Flow      Pain Score 0     Pain Loc      Pain Edu?      Excl. in [place]?     Constitutional: Alert and oriented. Well appearing and in no acute distress but does appear anxious,  she is slightly tremulous in both hands reporting she feels very nervous and has been like this for a month. Eyes: Conjunctivae are normal. Head: Atraumatic. Nose: No congestion/rhinnorhea. Mouth/Throat: Mucous membranes are moist. Neck: No stridor.  Cardiovascular: Normal rate, regular rhythm. Good peripheral circulation. Respiratory: Normal respiratory effort.  No retractions.  She speaks in full very clear sentences. Gastrointestinal: Ate half a sandwich, having no abdominal pain.  No distention. Musculoskeletal: No lower extremity tenderness nor edema. Neurologic:  Normal speech and language. No gross focal neurologic deficits are appreciated.  She does have a slight tremulousness in both hands. Skin:  Skin is warm, dry and intact. No rash noted. Psychiatric: Mood and affect are moderately anxious. Speech and behavior are normal.  ____________________________________________   LABS (all labs ordered are listed, but only abnormal results are displayed)  Labs Reviewed  URINALYSIS, COMPLETE (UACMP) WITH MICROSCOPIC - Abnormal; Notable for the following components:      Result Value   Color, Urine STRAW (*)    APPearance CLEAR (*)    Specific Gravity, Urine 1.002 (*)    Hgb urine dipstick SMALL (*)    Bacteria, UA RARE (*)    All other components within normal limits  BASIC METABOLIC PANEL - Abnormal; Notable for the following components:   Potassium 2.8 (*)    Chloride 96 (*)    Glucose, Bld 122 (*)    All other components within normal limits  CBC - Abnormal; Notable for the following components:   RDW 17.2 (*)    All other components within normal limits   ____________________________________________  EKG   ____________________________________________  RADIOLOGY   ____________________________________________   PROCEDURES  Procedure(s) performed: None  Procedures  Critical Care performed: No  ____________________________________________   INITIAL  IMPRESSION / ASSESSMENT AND PLAN / ED COURSE  Pertinent labs & imaging results that were available during my care of the patient were reviewed by me and considered in my medical decision making (see chart for details).   Based on patient's presentation, with the death of a family member exacerbating and causing what she described as a very nervous symptomatology I do suspect this is likely underlying anxiety and possibly significant adjustment.  She denies acute symptoms that would make me feel that she would need to be under IVC.  Because of her age I do think screening labs to evaluate for electrolyte abnormality and urinary tract infection would be helpful, and the patient is in agreement with this.  She is also very much agreeable to speaking to our psychiatrist and I discussed her case with Dr. Ronny Bacon who is happy to  see her    ----------------------------------------- 1:58 PM on 10/20/2018 -----------------------------------------  ----------------------------------------- 3:33 PM on 10/20/2018 -----------------------------------------  Lab work reassuring without evidence of acute finding to suggest cause for her anxious state.  Potassium is somewhat low, will repelete orally.  Ongoing care assigned to Dr. Jimmye Norman, follow-up on recommendations from psychiatry service.  Anticipate discharge based on recommendations from psychiatry   ____________________________________________   FINAL CLINICAL IMPRESSION(S) / ED DIAGNOSES  Final diagnoses:  Adjustment disorder with anxious mood        Note:  This document was prepared using Dragon voice recognition software and may include unintentional dictation errors       Delman Kitten, MD 10/20/18 1534

## 2018-10-20 NOTE — ED Notes (Signed)
Pt ambulated to bathroom with steady gait, brother remains at bedside

## 2018-10-20 NOTE — ED Notes (Signed)
Pt sitting on the side of bed holding cup of water. Pt tearful during conversation. Pt reports "the world just keeps going and things just keep happening and I just want it to stop". Pt lives with daughter and family member that is handicapped, she reports financial issues since husbands death. Reports feeling shaky and anxious despite taking xanax. A&Ox4, speech clear, denies SI.

## 2018-10-20 NOTE — ED Notes (Signed)
Pt given ice water, brother remains at bedside

## 2018-10-20 NOTE — ED Notes (Signed)
Pt given ice water.

## 2018-10-20 NOTE — Consult Note (Signed)
[facility] Face-to-Face Psychiatry Consult   Reason for Consult: Anxiety panic attacks  referring Physician: Delman Kitten patient Identification: Lydia Soto MRN:  [id number] Principal Diagnosis: <principal problem not specified> Diagnosis:  Active Problems:   * No active hospital problems. *   Total Time spent with patient: 1 hour  Subjective:   Lydia Soto is a 78 y.o. female patient who presented with anxiety in the context of social stressors.  HPI: Patient is a 78 year old female with a history of anxiety who presents the emergency department in a state of panic due to recent social stressors.  Patient explains that she is under severe financial stress.  Patient states she is the sole caretaker of her 77 year old intellectually disabled daughter.  Patient states that her and her husband had a financial plan on how to manage the resources for retirement, however patient's husband passed away on 10-03-2022 of this year.  Since that time patient has been investigating the finances herself only to realize that she does not have enough money to make ends meet.  Patient realizes that she will most likely have to sell her house in order to pay off the debts that her and her husband have occurred.  Patient does have some additional resources in the form of inheritance as well as an extra house, however patient feels very overwhelmed by the thought of managing all of the appointments meetings and paperwork necessary to reorganize her finances.  Patient does have social supports in the form of her brother who accompanied her to the emergency room today, as well as 2 daughters 1 of which is a Engineer, water the other of which is a Environmental health practitioner.  Attempts to have the patient relax by by providing perspective as well as offering the possible solutions of having her family help her were unsuccessful.  Patient does not deny that her family would be able to help her, but rather is not relieved by the thought of  this.  Patient has been dealing with some anxiety approximately 2 years ago since the death of her mother and had been started on Prozac 20 mg as well as Xanax 0.5 milligrams nightly.  Patient states that her medications are managed by her primary care doctor whom she has since called in hopes to alleviate her symptoms.  Patient was told to double her doses of both medications but still feels symptoms.  Patient denies any suicidal ideation or symptoms of depression.  Past Psychiatric History: Patient denies psychiatric history prior to 2 years ago when she started to feel increased anxiety due to the death of her mother.  At that time she was started on Lexapro to which she had a bad reaction and switched to Prozac which has been helpful for her.  Patient was also prescribed Xanax 0.5 mg nightly to help with sleep  Risk to Self:  No Risk to Others:  No Prior Inpatient Therapy:  No Prior Outpatient Therapy:  No  Past Medical History:  Past Medical History:  Diagnosis Date  . A-fib ([place])   . Diabetes mellitus without complication ([place])   . Hypertension    History reviewed. No pertinent surgical history. Family History: No family history on file. Family Psychiatric  History: Patient denies Social History:  Social History   Substance and Sexual Activity  Alcohol Use No     Social History   Substance and Sexual Activity  Drug Use Not on file    Social History   Socioeconomic History  .  Marital status: Married    Spouse name: Not on file  . Number of children: Not on file  . Years of education: Not on file  . Highest education level: Not on file  Occupational History  . Not on file  Social Needs  . Financial resource strain: Not on file  . Food insecurity    Worry: Not on file    Inability: Not on file  . Transportation needs    Medical: Not on file    Non-medical: Not on file  Tobacco Use  . Smoking status: Never Smoker  . Smokeless tobacco: Never Used  Substance and  Sexual Activity  . Alcohol use: No  . Drug use: Not on file  . Sexual activity: Not on file  Lifestyle  . Physical activity    Days per week: Not on file    Minutes per session: Not on file  . Stress: Not on file  Relationships  . Social Herbalist on phone: Not on file    Gets together: Not on file    Attends religious service: Not on file    Active member of club or organization: Not on file    Attends meetings of clubs or organizations: Not on file    Relationship status: Not on file  Other Topics Concern  . Not on file  Social History Narrative  . Not on file   Additional Social History: Patient states she lives at home with her 100 year old daughter who is intellectually disabled.  She denies any substance use    Allergies:   Allergies  Allergen Reactions  . Phenobarbital   . Sulfur     Labs:  Results for orders placed or performed during the hospital encounter of 10/20/18 (from the past 48 hour(s))  Urinalysis, Complete w Microscopic     Status: Abnormal   Collection Time: 10/20/18  2:04 PM  Result Value Ref Range   Color, Urine STRAW (A) YELLOW   APPearance CLEAR (A) CLEAR   Specific Gravity, Urine 1.002 (L) 1.005 - 1.030   pH 6.0 5.0 - 8.0   Glucose, UA NEGATIVE NEGATIVE mg/dL   Hgb urine dipstick SMALL (A) NEGATIVE   Bilirubin Urine NEGATIVE NEGATIVE   Ketones, ur NEGATIVE NEGATIVE mg/dL   Protein, ur NEGATIVE NEGATIVE mg/dL   Nitrite NEGATIVE NEGATIVE   Leukocytes,Ua NEGATIVE NEGATIVE   WBC, UA 0-5 0 - 5 WBC/hpf   Bacteria, UA RARE (A) NONE SEEN   Squamous Epithelial / LPF 0-5 0 - 5    Comment: Performed at [facility], [address] [place], [place] [id number]  Basic metabolic panel     Status: Abnormal   Collection Time: 10/20/18  2:04 PM  Result Value Ref Range   Sodium 137 135 - 145 mmol/L   Potassium 2.8 (L) 3.5 - 5.1 mmol/L   Chloride 96 (L) 98 - 111 mmol/L   CO2 30 22 - 32 mmol/L   Glucose, Bld 122 (H) 70 - 99 mg/dL    BUN 10 8 - 23 mg/dL   Creatinine, Ser 0.72 0.44 - 1.00 mg/dL   Calcium 9.6 8.9 - 10.3 mg/dL   GFR calc non Af Amer >60 >60 mL/min   GFR calc Af Amer >60 >60 mL/min   Anion gap 11 5 - 15    Comment: Performed at [facility], [address] [place], [place] [address]  CBC     Status: Abnormal   Collection Time: 10/20/18  2:04  PM  Result Value Ref Range   WBC 5.5 4.0 - 10.5 K/uL   RBC 4.66 3.87 - 5.11 MIL/uL   Hemoglobin 12.4 12.0 - 15.0 g/dL   HCT 38.3 36.0 - 46.0 %   MCV 82.2 80.0 - 100.0 fL   MCH 26.6 26.0 - 34.0 pg   MCHC 32.4 30.0 - 36.0 g/dL   RDW 17.2 (H) 11.5 - 15.5 %   Platelets 265 150 - 400 K/uL   nRBC 0.0 0.0 - 0.2 %    Comment: Performed at [facility], [address] [place], [place] [address]    No current facility-administered medications for this encounter.    Current Outpatient Medications  Medication Sig Dispense Refill  . ALPRAZolam (XANAX) 0.5 MG tablet Take 0.5 mg by mouth at bedtime as needed for anxiety.    Marland Kitchen FLUoxetine (PROZAC) 20 MG tablet Take 20 mg by mouth daily.    . hydrochlorothiazide (HYDRODIURIL) 25 MG tablet Take 25 mg by mouth daily.    . metoprolol tartrate (LOPRESSOR) 50 MG tablet Take 50 mg by mouth 2 (two) times daily.    . Multiple Vitamin (MULTIVITAMIN) tablet Take 1 tablet by mouth daily.    . pantoprazole (PROTONIX) 40 MG tablet Take 40 mg by mouth daily.    . potassium chloride (K-DUR) 10 MEQ tablet Take 10 mEq by mouth daily.    . predniSONE (DELTASONE) 10 MG tablet Take 2 tablets on day 1 and 1 tablet daily for the next 4 days 6 tablet 0  . tizanidine (ZANAFLEX) 2 MG capsule Take 1 capsule (2 mg total) by mouth 3 (three) times daily. 15 capsule 0  . warfarin (COUMADIN) 5 MG tablet Take 5 mg by mouth daily.      Musculoskeletal: Strength & Muscle Tone: decreased Gait & Station: unsteady Patient leans: Left  Psychiatric Specialty Exam: Physical Exam  Review of Systems  Constitutional: Positive for  malaise/fatigue. Negative for chills, fever and weight loss.  HENT: Positive for hearing loss. Negative for tinnitus.   Eyes: Negative for blurred vision.  Respiratory: Negative for cough.   Cardiovascular: Negative for chest pain.  Gastrointestinal: Negative for heartburn.  Genitourinary: Negative for dysuria.  Musculoskeletal: Negative for myalgias.  Skin: Negative for itching and rash.  Neurological: Positive for tremors.  Psychiatric/Behavioral: Negative for depression, hallucinations, substance abuse and suicidal ideas. The patient is nervous/anxious.   All other systems reviewed and are negative.   Blood pressure (!) 146/95, pulse 74, temperature 98.8 F (37.1 C), temperature source Oral, resp. rate (!) 22, height 5\' 4"  (1.626 m), weight 90 kg, SpO2 98 %.Body mass index is 34.06 kg/m.  General Appearance: Fairly Groomed  Eye Contact:  Good  Speech:  Clear and Coherent  Volume:  Normal  Mood:  Anxious and Hopeless  Affect:  Congruent and Tearful  Thought Process:  Linear  Orientation:  Full (Time, Place, and Person)  Thought Content:  Obsessions and Rumination  Suicidal Thoughts:  No  Homicidal Thoughts:  No  Memory:  Immediate;   Good Recent;   Good Remote;   Good  Judgement:  Fair  Insight:  Shallow  Psychomotor Activity:  Normal  Concentration:  Concentration: Fair  Recall:  [employer] of Knowledge:  Fair  Language:  Fair  Akathisia:  No  Handed:  Right  AIMS (if indicated):     Assets:  Catering manager Housing Leisure Time Physical Health Social Support  ADL's:  Intact  Cognition:  WNL  Sleep:  Treatment Plan Summary: 78 year old female with history of anxiety presenting with panic attacks in the context of social stressors eluding financial stress caregiver stress.  Patient currently on medications although dosage not high enough to cover for symptoms.  5 mg of diazepam administered in the ED with good effect.  Patient will be  discharged with an order and recommendations to follow-up with outpatient psychiatric care.   Disposition: No evidence of imminent risk to self or others at present.   Patient does not meet criteria for psychiatric inpatient admission. Supportive therapy provided about ongoing stressors. Discussed crisis plan, support from social network, calling 911, coming to the Emergency Department, and calling Suicide Hotline.  Dixie Dials, MD 10/20/2018 5:26 PM

## 2018-10-20 NOTE — ED Notes (Signed)
Pt resting in bed, pt states it is ok for her brother Karrie Doffing to come back and sit with her

## 2020-07-30 ENCOUNTER — Ambulatory Visit
Admission: EM | Admit: 2020-07-30 | Discharge: 2020-07-30 | Disposition: A | Discharge status: Home / Self Care | Payer: Medicare Other | Attending: Family Medicine | Admitting: Family Medicine

## 2020-07-30 ENCOUNTER — Other Ambulatory Visit: Payer: Self-pay

## 2020-07-30 DIAGNOSIS — Z20822 Contact with and (suspected) exposure to covid-19: Secondary | ICD-10-CM | POA: Diagnosis not present

## 2020-07-30 NOTE — Discharge Instructions (Signed)

## 2020-07-30 NOTE — ED Triage Notes (Signed)
Pt sts she was exposed to covid. No symptoms.

## 2020-07-31 LAB — SARS CORONAVIRUS 2 (TAT 6-24 HRS): SARS Coronavirus 2: NEGATIVE

## 2020-12-16 DIAGNOSIS — I872 Venous insufficiency (chronic) (peripheral): Secondary | ICD-10-CM | POA: Insufficient documentation

## 2021-05-04 ENCOUNTER — Ambulatory Visit: Admission: EM | Admit: 2021-05-04 | Discharge: 2021-05-04 | Payer: Medicare Other

## 2021-05-04 DIAGNOSIS — G44229 Chronic tension-type headache, not intractable: Secondary | ICD-10-CM | POA: Insufficient documentation

## 2021-05-04 DIAGNOSIS — K5730 Diverticulosis of large intestine without perforation or abscess without bleeding: Secondary | ICD-10-CM | POA: Insufficient documentation

## 2021-05-04 DIAGNOSIS — D649 Anemia, unspecified: Secondary | ICD-10-CM | POA: Insufficient documentation

## 2021-05-04 DIAGNOSIS — K219 Gastro-esophageal reflux disease without esophagitis: Secondary | ICD-10-CM | POA: Insufficient documentation

## 2021-05-04 DIAGNOSIS — I079 Rheumatic tricuspid valve disease, unspecified: Secondary | ICD-10-CM | POA: Insufficient documentation

## 2021-05-04 DIAGNOSIS — S0990XA Unspecified injury of head, initial encounter: Secondary | ICD-10-CM | POA: Diagnosis not present

## 2021-05-04 DIAGNOSIS — E785 Hyperlipidemia, unspecified: Secondary | ICD-10-CM | POA: Insufficient documentation

## 2021-05-04 DIAGNOSIS — R0781 Pleurodynia: Secondary | ICD-10-CM

## 2021-05-04 DIAGNOSIS — E66812 Obesity, class 2: Secondary | ICD-10-CM | POA: Insufficient documentation

## 2021-05-04 NOTE — ED Provider Notes (Signed)
[facility]    CSN: [id number] Arrival date & time: 05/04/21  1433      History   Chief Complaint Chief Complaint  Patient presents with   Fall    HPI Lydia Soto is a 81 y.o. female.   Patient presents for evaluation of left-sided rib pain and knot to the left side of head occurring today after fall.  Endorses that she was getting a box out of the car when she lost balance landing directly onto her left side.  Denies loss of consciousness.  Painful to take deep breaths and movement.  Denies headache, dizziness, lightheadedness, memory or speech changes.  History of A-fib, diabetes mellitus and hypertension.  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   Stasis dermatitis of both legs 12/16/2020   Adjustment disorder 10/20/2018   After-cataract with vision obscured of both eyes 09/07/2018   Subretinal hemorrhage of left eye 04/07/2018   Deformity, toe acquired, left 03/06/2018   Pain of toe of left foot 03/06/2018   Depression with anxiety 11/02/2016   Hypertrophic cardiomyopathy (HCC) 12/02/2015   OSA (obstructive sleep apnea) 12/02/2015   Abnormal INR 08/13/2015   Adenomatous polyp of ascending colon 08/08/2015   Impaired fasting glucose 07/16/2015   Pain of right hip joint 04/03/2015   Benign essential tremor 12/26/2014   Encounter for therapeutic drug level monitoring 12/18/2014   Atrial fibrillation (HCC) 08/07/2014   Benign essential hypertension 08/07/2014   Long term current use of  anticoagulant 08/07/2014   Tremor 08/07/2014   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 No pertinent surgical history.  OB History   No obstetric history on file.      Home Medications    Prior to Admission medications   Medication Sig Start Date End Date Taking? Authorizing Provider  warfarin (COUMADIN) 5 MG tablet Take 5 mg by mouth daily. 1/2 of 5mg .   Yes [provider]  acetaminophen (TYLENOL) 325 MG tablet Take by mouth.    [provider]  acetaminophen (TYLENOL) 500 MG tablet Take by mouth.    [provider]  albuterol (VENTOLIN HFA) 108 (90 Base) MCG/ACT inhaler  11/12/19   [provider]  ALPRAZolam Prudy Feeler) 0.5 MG tablet Take 0.5 mg by mouth at bedtime as needed for anxiety.    [provider]  amLODipine (NORVASC) 5 MG tablet  10/09/19   [provider]  cefUROXime (CEFTIN) 500 MG tablet  11/12/19   [provider]  cephALEXin (KEFLEX) 500 MG capsule  06/15/19   [provider]  clobetasol ointment (TEMOVATE) 0.05 % Apply 1 application topically twice daily 05/03/18   [provider]  conjugated estrogens (PREMARIN) vaginal cream Place vaginally. 02/23/17   [provider]  COVID-19 mRNA vaccine, Pfizer, 30 MCG/0.3ML injection  01/10/20   [provider]  cyclobenzaprine (FLEXERIL) 5 MG  tablet TK 1 T PO BID 09/06/16   [provider]  diazepam (VALIUM) 5 MG tablet Take 1 tablet (5 mg total) by mouth every 8 (eight) hours as needed for anxiety. 10/20/18   Emily Filbert, MD  diltiazem (CARDIZEM LA) 180 MG 24 hr tablet Take 1 tablet by mouth daily.    [provider]  DULoxetine (CYMBALTA) 30 MG capsule Take 30 mg by mouth daily. 04/11/21   [provider]  esomeprazole (NEXIUM) 40 MG capsule Take  by mouth.    [provider]  FLUoxetine (PROZAC) 20 MG tablet Take 20 mg by mouth daily.    [provider]  FLUoxetine (PROZAC) 40 MG capsule  09/04/19   [provider]  fluticasone (FLONASE) 50 MCG/ACT nasal spray Place 2 sprays into both nostrils daily. 09/15/20   [provider]  furosemide (LASIX) 20 MG tablet  04/17/20   [provider]  hydrochlorothiazide (HYDRODIURIL) 25 MG tablet Take 25 mg by mouth daily.    [provider]  ketorolac (ACULAR) 0.5 % ophthalmic solution Apply to eye.    [provider]  losartan (COZAAR) 100 MG tablet  05/26/20   [provider]  losartan (COZAAR) 50 MG tablet  08/09/19   [provider]  metoprolol tartrate (LOPRESSOR) 50 MG tablet Take 50 mg by mouth 2 (two) times daily.    [provider]  mirtazapine (REMERON) 15 MG tablet Take 15 mg by mouth at bedtime. 12/09/20   [provider]  moxifloxacin (VIGAMOX) 0.5 % ophthalmic solution Apply to eye.    [provider]  Multiple Vitamin (MULTIVITAMIN) capsule Take by mouth.    [provider]  olopatadine (PATANOL) 0.1 % ophthalmic solution Place 1 drop into both eyes 2 times daily. 09/15/20   [provider]  ondansetron (ZOFRAN-ODT) 4 MG disintegrating tablet  06/22/19   [provider]  pantoprazole (PROTONIX) 40 MG tablet Take 40 mg by mouth daily.    [provider]  potassium chloride (KLOR-CON M) 10 MEQ tablet Take by mouth. 04/03/21   [provider]  prednisoLONE acetate (PRED FORTE) 1 % ophthalmic suspension Apply to eye.    [provider]  predniSONE (DELTASONE) 10 MG tablet Take 2 tablets on day 1 and 1 tablet daily for the next 4 days 01/10/17   Lutricia Feil, PA-C  tizanidine (ZANAFLEX) 2 MG capsule Take 1 capsule (2 mg total) by mouth 3 (three) times daily. 01/10/17   Lutricia Feil, PA-C  triamcinolone cream (KENALOG) 0.1 %   04/29/20   [provider]    Family History No family history on file.  Social History Social History   Tobacco Use   Smoking status: Never    Passive exposure: Never   Smokeless tobacco: Never  Vaping Use   Vaping Use: Never used  Substance Use Topics   Alcohol use: No   Drug use: Not Currently     Allergies   Propoxyphene, Sulfa antibiotics, Sulfamethoxazole-trimethoprim, Morphine, Phenobarbital, Sulfur, Diazepam, Elemental sulfur, Iodine-131, Other, and Lisinopril   Review of Systems Review of Systems   Physical Exam Triage Vital Signs ED Triage Vitals  Enc Vitals Group     BP 05/04/21 1502 116/79     Pulse Rate 05/04/21 1502 84     Resp 05/04/21 1502 20     Temp 05/04/21 1502 98.1 F (36.7 C)     Temp Source 05/04/21 1502 Oral     SpO2 05/04/21  1502 93 %     Weight 05/04/21 1458 198 lb 6.6 oz (90 kg)     Height 05/04/21 1458 5\' 4"  (1.626 m)     Head Circumference --      Peak Flow --      Pain Score 05/04/21 1457 6     Pain Loc --      Pain Edu? --      Excl. in [place]? --    No data found.  Updated Vital Signs BP 116/79 (BP Location: Left Arm)   Pulse 84   Temp 98.1 F (36.7 C) (Oral)   Resp 20   Ht 5\' 4"  (1.626 m)   Wt 198 lb 6.6 oz (90 kg)   SpO2 93%   BMI 34.06 kg/m   Visual Acuity Right Eye Distance:   Left Eye Distance:   Bilateral Distance:    Right Eye Near:   Left Eye Near:    Bilateral Near:     Physical Exam   UC Treatments / Results  Labs (all labs ordered are listed, but only abnormal results are displayed) Labs Reviewed - No data to display  EKG   Radiology No results found.  Procedures Procedures (including critical care time)  Medications Ordered in UC Medications - No data to display  Initial Impression / Assessment and Plan / UC Course  I have reviewed the triage vital signs and the nursing notes.  Pertinent labs & imaging results that were available during my care of the patient were reviewed by  me and considered in my medical decision making (see chart for details).  Head, initial encounter Left-sided rib pain  Patient sent to the nearest emergency department for further evaluation and need for CT imaging as she hit her head and takes Coumadin.  Final Clinical Impressions(s) / UC Diagnoses   Final diagnoses:  Injury of head, initial encounter     Discharge Instructions      [place] [address]  Head right towards [place] at the split   Take [address] toward [facility] Go for 0.3 mi.  Then 0.29 miles  Turn left onto [address] Go for 0.3 mi.  Then 0.34 miles  Turn right onto [address] toward Hospital. Go for 0.5 mi.  Then 0.45 miles  Turn right. Go for 49 ft.   ED Prescriptions   None    PDMP not reviewed this encounter.   Valinda Hoar, NP 05/04/21 1547

## 2021-05-04 NOTE — Discharge Instructions (Signed)
Get on [address] ? ?Head right towards [facility] at the split  ? ?Take exit [address] toward [facility]. Go for 0.3 mi. ? ?Then 0.29 miles ? ?Turn left onto [address] Go for 0.3 mi. ? ?Then 0.34 miles ? ?Turn right onto [address] toward [place] for 0.5 mi. ? ?Then 0.45 miles ? ?Turn right. Go for 49 ft. ?

## 2021-05-04 NOTE — ED Notes (Signed)
Patient is being discharged from the Urgent Care and sent to the Emergency Department via POV . Per White, NP, patient is in need of higher level of care due to requiring a CT scan. Patient is aware and verbalizes understanding of plan of care.  ?Vitals:  ? 05/04/21 1502  ?BP: 116/79  ?Pulse: 84  ?Resp: 20  ?Temp: 98.1 ?F (36.7 ?C)  ?SpO2: 93%  ?  ?

## 2021-05-04 NOTE — ED Triage Notes (Signed)
Patient is here for "Fall". 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 No lacerations. "Does hurt to breath at times". On "blood thinner".  ?

## 2021-11-02 ENCOUNTER — Encounter: Payer: Self-pay | Admitting: Emergency Medicine

## 2021-11-02 ENCOUNTER — Ambulatory Visit
Admission: EM | Admit: 2021-11-02 | Discharge: 2021-11-02 | Disposition: A | Discharge status: Home / Self Care | Payer: Medicare Other

## 2021-11-02 DIAGNOSIS — R202 Paresthesia of skin: Secondary | ICD-10-CM | POA: Diagnosis not present

## 2021-11-02 NOTE — Discharge Instructions (Signed)
Your neurologic evaluation was reassuring but it does not explain the numbness and grasping difficulty you are describing.  You need to follow-up with your PCP as scheduled on 12/03/21- Call to see if you can be seen sooner.  I feel that you need to have a referral to neurology and have an MRI of your brain to look for reasons for your symptoms- we are not able to do this at the urgent care.  Continue to use you walker for stability and to prevent falls when walking.  If you develop any headache, changes in vision, weakness, or suffer another fall you need to go to the ER for evaluation.

## 2021-11-02 NOTE — ED Triage Notes (Signed)
Pt presents with fatigue, extremely cold, excessive thirst and bilateral hand numbness and tingling for several months. Pt was seen at the ER on 10/29/2021, and was sent home due to no findings of symptoms and advised to follow up with PCP. Pt states she is unable to write or handle her medications.  She reports using weed and bug killer about a month ago and is afraid she may have poisoning from those chemicals.

## 2021-11-02 NOTE — ED Provider Notes (Signed)
[facility]    CSN: [id number] Arrival date & time: 11/02/21  1052      History   Chief Complaint Chief Complaint  Patient presents with   Fatigue   Numbness    HPI Lydia Soto is a 81 y.o. female.   HPI  81 year old female here for evaluation of neurologic issues.  Patient reports that she has had ongoing issues since April of this year which include 6 falls between April and June, but no further falls since June.  Feeling fatigued, extremely cold, excessive thirst, numbness and tingling in both hands as well as weakness in her grip.  She states she has trouble manipulating small objects such as picking up her medications or separating sheets of paper.  She also states that she is unable to write due to the numbness and tingling in her hands.  She was evaluated at the ER [facility] on 10/29/2021 but did not have any imaging of her brain.  They did blood work which was all very reassuring.  She also reports that she has been using a bug and weed killer around her yard and in her home and she is concerned she may have developed some toxicity.  She has since stopped using the bug killer.  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   Stasis dermatitis of both legs 12/16/2020   Adjustment disorder 10/20/2018   After-cataract with vision obscured of both eyes 09/07/2018   Subretinal hemorrhage of left eye 04/07/2018   Deformity, toe acquired, left 03/06/2018   Pain of toe of left foot 03/06/2018    Depression with anxiety 11/02/2016   Hypertrophic cardiomyopathy ([place]) 12/02/2015   OSA (obstructive sleep apnea) 12/02/2015   Abnormal INR 08/13/2015   Adenomatous polyp of ascending colon 08/08/2015   Impaired fasting glucose 07/16/2015   Pain of right hip joint 04/03/2015   Benign essential tremor 12/26/2014   Encounter for therapeutic drug level monitoring 12/18/2014   Atrial fibrillation ([place]) 08/07/2014   Benign essential hypertension 08/07/2014   Long term current use of anticoagulant 08/07/2014   Tremor 08/07/2014   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 No pertinent surgical history.  OB History   No obstetric history on file.      Home Medications    Prior to Admission medications   Medication Sig Start Date End Date Taking? Authorizing Provider  acetaminophen (TYLENOL) 325 MG tablet Take by mouth.    [provider]  acetaminophen (TYLENOL) 500 MG tablet Take by mouth.    [provider]  albuterol (VENTOLIN HFA) 108 (90 Base) MCG/ACT inhaler  11/12/19   [provider]  ALPRAZolam Duanne Moron) 0.5 MG tablet Take 0.5 mg by mouth at bedtime as needed for anxiety.    [provider]  amLODipine (NORVASC) 5 MG tablet  10/09/19   [provider]  cefUROXime (CEFTIN) 500 MG tablet  11/12/19   [provider]  cephALEXin (KEFLEX) 500 MG capsule  06/15/19   [provider]  clobetasol ointment (TEMOVATE) 0.01 % Apply 1 application topically twice daily 05/03/18   [provider]  conjugated estrogens (PREMARIN) vaginal cream Place vaginally. 02/23/17   [provider]  COVID-19 mRNA vaccine, Pfizer, 30 MCG/0.3ML injection  01/10/20   [provider]  cyclobenzaprine (FLEXERIL) 5 MG tablet TK 1 T PO BID 09/06/16   [provider]   diazepam (VALIUM) 5 MG tablet Take 1 tablet (5 mg total) by mouth every 8 (eight) hours as needed for anxiety. 10/20/18   Earleen Newport, MD  diltiazem (CARDIZEM LA) 180 MG 24 hr tablet Take 1 tablet by mouth daily.    [provider]  DULoxetine (CYMBALTA) 30 MG capsule Take 30 mg by mouth daily. 04/11/21   [provider]  esomeprazole (NEXIUM) 40 MG capsule Take by mouth.    [provider]  FLUoxetine (PROZAC) 20 MG tablet Take 20 mg by mouth daily.    [provider]  FLUoxetine (PROZAC) 40 MG capsule  09/04/19   [provider]  fluticasone (FLONASE) 50 MCG/ACT nasal spray Place 2 sprays into both nostrils daily. 09/15/20   [provider]  furosemide (LASIX) 20 MG tablet  04/17/20   [provider]  hydrochlorothiazide (HYDRODIURIL) 25 MG tablet Take 25 mg by mouth daily.    [provider]  ketorolac (ACULAR) 0.5 % ophthalmic solution Apply to eye.    [provider]  losartan (COZAAR) 100 MG tablet  05/26/20   [provider]  losartan (COZAAR) 50 MG tablet  08/09/19   [provider]  metoprolol tartrate (LOPRESSOR) 50 MG tablet Take 50 mg by mouth 2 (two) times daily.    [provider]  mirtazapine (REMERON) 15 MG tablet Take 15 mg by mouth at bedtime. 12/09/20   [provider]  moxifloxacin (VIGAMOX) 0.5 % ophthalmic solution Apply to eye.    [provider]  Multiple Vitamin (MULTIVITAMIN) capsule Take by mouth.    [provider]  olopatadine (PATANOL) 0.1 % ophthalmic solution Place 1 drop into both eyes 2 times daily. 09/15/20   [provider]  ondansetron (ZOFRAN-ODT) 4 MG disintegrating tablet  06/22/19   [provider]  pantoprazole (PROTONIX) 40 MG tablet Take 40 mg by mouth daily.    [provider]  potassium chloride (KLOR-CON M) 10 MEQ tablet Take by mouth. 04/03/21   [provider]  prednisoLONE acetate  (PRED FORTE) 1 % ophthalmic suspension Apply to eye.    [provider]  predniSONE (DELTASONE) 10 MG tablet Take 2 tablets on day 1 and 1 tablet daily for the next 4 days 01/10/17   Lorin Picket, PA-C  tizanidine (ZANAFLEX) 2 MG capsule Take 1 capsule (2 mg total) by mouth 3 (three) times daily. 01/10/17   Lorin Picket, PA-C  triamcinolone cream (KENALOG) 0.1 %  04/29/20   [provider]  warfarin (COUMADIN) 5 MG tablet Take 5 mg by mouth daily. 1/2 of '5mg'$ .    [provider]    Family History No family history on file.  Social History Social History   Tobacco Use   Smoking status: Never    Passive exposure: Never   Smokeless tobacco: Never  Vaping Use   Vaping Use: Never used  Substance Use Topics   Alcohol use: No  Drug use: Not Currently     Allergies   Propoxyphene, Sulfa antibiotics, Sulfamethoxazole-trimethoprim, Morphine, Phenobarbital, Sulfur, Diazepam, Elemental sulfur, Iodine-131, Other, and Lisinopril   Review of Systems Review of Systems  Musculoskeletal:  Positive for gait problem.  Neurological:  Positive for weakness and numbness. Negative for dizziness, facial asymmetry and headaches.  Hematological: Negative.   Psychiatric/Behavioral: Negative.       Physical Exam Triage Vital Signs ED Triage Vitals  Enc Vitals Group     BP 11/02/21 1109 111/65     Pulse Rate 11/02/21 1109 99     Resp 11/02/21 1109 16     Temp 11/02/21 1109 97.8 F (36.6 C)     Temp Source 11/02/21 1109 Oral     SpO2 11/02/21 1109 97 %     Weight --      Height --      Head Circumference --      Peak Flow --      Pain Score 11/02/21 1110 0     Pain Loc --      Pain Edu? --      Excl. in [place]? --    No data found.  Updated Vital Signs BP 111/65 (BP Location: Left Arm)   Pulse 99   Temp 97.8 F (36.6 C) (Oral)   Resp 16   SpO2 97%   Visual Acuity Right Eye Distance:   Left Eye Distance:   Bilateral Distance:    Right Eye  Near:   Left Eye Near:    Bilateral Near:     Physical Exam Vitals and nursing note reviewed.  Constitutional:      Appearance: Normal appearance. She is not ill-appearing.  HENT:     Head: Normocephalic and atraumatic.  Eyes:     General: No scleral icterus.    Extraocular Movements: Extraocular movements intact.     Conjunctiva/sclera: Conjunctivae normal.     Pupils: Pupils are equal, round, and reactive to light.  Cardiovascular:     Rate and Rhythm: Normal rate and regular rhythm.     Pulses: Normal pulses.     Heart sounds: Normal heart sounds. No murmur heard.    No friction rub. No gallop.  Pulmonary:     Effort: Pulmonary effort is normal.     Breath sounds: Normal breath sounds. No wheezing, rhonchi or rales.  Skin:    General: Skin is warm and dry.     Capillary Refill: Capillary refill takes less than 2 seconds.     Findings: No erythema or rash.  Neurological:     General: No focal deficit present.     Mental Status: She is alert and oriented to person, place, and time.     Cranial Nerves: No cranial nerve deficit.     Motor: No weakness.     Coordination: Coordination normal.     Deep Tendon Reflexes: Reflexes normal.  Psychiatric:        Mood and Affect: Mood normal.        Behavior: Behavior normal.        Thought Content: Thought content normal.        Judgment: Judgment normal.      UC Treatments / Results  Labs (all labs ordered are listed, but only abnormal results are displayed) Labs Reviewed - No data to display  EKG   Radiology No results found.  Procedures Procedures (including critical care time)  Medications Ordered in UC Medications - No data to display  Initial Impression / Assessment and Plan / UC Course  I have reviewed the triage vital signs and the nursing notes.  Pertinent labs & imaging results that were available during my care of the patient were reviewed by me and considered in my medical decision making (see chart  for details).   Patient is a nontoxic-appearing 81 year old female here for evaluation of 6 months worth of paresthesias in both hands with resultant difficulty with fine motor coordination such as picking up her medications or separating sheets of paper.  Also she expresses difficulty with writing.  On exam patient is alert and oriented x3 and she is able to recall the history of events with clarity.  Cranial nerves II through XII are intact and patient has a normal finger-to-nose bilaterally and does not demonstrate any pronator drift.  She is using a walker at present, which she does not use at baseline, for stability.  She was able to transfer from the chair to the stretcher for her exam and she does walk with a shorter gait but no shuffle.  She denies any family history of multiple sclerosis or Parkinson's.  She does have a history of anemia but her CBC from 10/29/2021 showed an H&H of 13.9 and 41.5.  At that same visit she had a B12 level checked which was 674 and normal.  Chemistry panel showed a BUN and creatinine of 24 and 0.83 on 10/29/2021 which was largely unchanged from her chemistry 10/02/2021 showing a BUN of 25 and a creatinine is 0.81.  The exact source of the patient's paresthesias and fine motor difficulties is unclear but her neurologic exam is reassuring.  I have advised her that she needs to follow-up with her PCP and request a referral to neurology.  She also needs advanced imaging of her brain.  ER precautions reviewed.   Final Clinical Impressions(s) / UC Diagnoses   Final diagnoses:  Paresthesias     Discharge Instructions      Your neurologic evaluation was reassuring but it does not explain the numbness and grasping difficulty you are describing.  You need to follow-up with your PCP as scheduled on 12/03/21- Call to see if you can be seen sooner.  I feel that you need to have a referral to neurology and have an MRI of your brain to look for reasons for your symptoms- we  are not able to do this at the urgent care.  Continue to use you walker for stability and to prevent falls when walking.  If you develop any headache, changes in vision, weakness, or suffer another fall you need to go to the ER for evaluation.     ED Prescriptions   None    PDMP not reviewed this encounter.   Margarette Canada, NP 11/02/21 1229

## 2021-11-06 ENCOUNTER — Other Ambulatory Visit: Payer: Self-pay

## 2021-11-06 ENCOUNTER — Emergency Department
Admission: EM | Admit: 2021-11-06 | Discharge: 2021-11-07 | Disposition: A | Discharge status: Home / Self Care | Payer: Medicare Other | Attending: Emergency Medicine | Admitting: Emergency Medicine

## 2021-11-06 ENCOUNTER — Emergency Department: Payer: Medicare Other

## 2021-11-06 DIAGNOSIS — R200 Anesthesia of skin: Secondary | ICD-10-CM | POA: Diagnosis not present

## 2021-11-06 DIAGNOSIS — R202 Paresthesia of skin: Secondary | ICD-10-CM | POA: Insufficient documentation

## 2021-11-06 NOTE — ED Provider Notes (Signed)
[facility] Provider Note    Event Date/Time   First MD Initiated Contact with Patient 11/06/21 2313     (approximate)   History   Numbness   HPI  Lydia Soto is a 81 y.o. female who presents to the ED for evaluation of Numbness   I reviewed PCP visit from yesterday, ED visit at [facility] from 10/12 with complaints of the same symptoms. Spine clinic appt in 1 week.   She presents to the ED for further evaluation of 1-2 months of chronic paresthesias to her bilateral hands. She reports multiple ED and urgent care visits for the same complaints and "everything is always normal."  She reports getting up from a nap this evening and feeling like "there was stocking or hose on my legs when there was not."  She reports she was able to get up and ambulate she typically does and had no other neurologic sensation changes.  No recent falls or back.  No fevers but like features.  Reports her legs are feeling better now that she is here.  Reports frustration that she does not know what is going on.  Reports that her PCP scheduled an MRI and referred her to the spine clinic as an appointment in 1 week.   Physical Exam   Triage Vital Signs: ED Triage Vitals [11/06/21 2305]  Enc Vitals Group     BP 125/76     Pulse Rate 77     Resp 18     Temp 98.7 F (37.1 C)     Temp Source Oral     SpO2 91 %     Weight      Height      Head Circumference      Peak Flow      Pain Score 0     Pain Loc      Pain Edu?      Excl. in [place]?     Most recent vital signs: Vitals:   11/06/21 2305 11/07/21 0122  BP: 125/76 130/78  Pulse: 77 78  Resp: 18 18  Temp: 98.7 F (37.1 C)   SpO2: 91% 98%    General: Awake, no distress.  Stands independently and ambulates with mild assistance, keeping hand on me. CV:  Good peripheral perfusion.  Resp:  Normal effort.  Abd:  No distention.  MSK:  No deformity noted.  Neuro:  No focal deficits appreciated. Cranial nerves II through XII  intact 5/5 strength and sensation in all 4 extremities Sensation intact bilateral hands Other:  No signs of trauma to the back.  No spinal step-offs or bony tenderness.   ED Results / Procedures / Treatments   Labs (all labs ordered are listed, but only abnormal results are displayed) Labs Reviewed - No data to display  EKG   RADIOLOGY CT head interpreted by me without evidence of acute intracranial pathology CT cervical spine interpreted by me without evidence of acute fracture or dislocation  Official radiology report(s): CT HEAD WO CONTRAST (5MM)  Result Date: 11/07/2021 CLINICAL DATA:  Worsening paresthesias for 2 months. EXAM: CT HEAD WITHOUT CONTRAST CT CERVICAL SPINE WITHOUT CONTRAST TECHNIQUE: Multidetector CT imaging of the head and cervical spine was performed following the standard protocol without intravenous contrast. Multiplanar CT image reconstructions of the cervical spine were also generated. RADIATION DOSE REDUCTION: This exam was performed according to the departmental dose-optimization program which includes automated exposure control, adjustment of the mA and/or kV according to patient  size and/or use of iterative reconstruction technique. COMPARISON:  None Available. FINDINGS: CT HEAD FINDINGS Brain: No evidence of acute infarction, hemorrhage, hydrocephalus, extra-axial collection or mass lesion/mass effect. Multiple areas of low-attenuation in the periventricular and subcortical white matter presumed advanced chronic microvascular ischemic changes. Vascular: No hyperdense vessel or unexpected calcification. Skull: Normal. Negative for fracture or focal lesion. Sinuses/Orbits: No acute finding.  Bilateral lens extraction. Other: None CT CERVICAL SPINE FINDINGS Alignment: Straightening of the cervical spine and mild dextroscoliosis. Skull base and vertebrae: There is osseous remodeling of the dense mild anterior subluxation of the dens. There is a chronic fracture at the  base of the odontoid process with advanced arthritic changes of the lateral atlantoaxial joints. Disc height loss and interbody fusion of the C4-C5. Soft tissues and spinal canal: No prevertebral fluid or swelling. No visible canal hematoma. Disc levels: C2-C3: Mild bilateral facet joint arthropathy. No significant spinal canal or neural foraminal stenosis. C3-C4: Disc height loss with mild spinal canal and moderate bilateral neural foraminal stenosis. Moderate facet joint arthropathy. C4-C5: Near complete disc height loss. Interbody fusion. Moderate facet joint arthropathy. Mild left and moderate right neural foraminal stenosis. C5-C6: Mild subluxation of the C5. Disc height loss and mild spinal canal stenosis. Moderate left and severe right neural foraminal stenosis. C6-C7: Disc height loss with mild bilateral neural foraminal stenosis. C7-T1:  No significant finding. Upper chest: Atherosclerotic disease of thoracic aorta. Biapical pleural/parenchymal scarring. No acute abnormality. Other: None IMPRESSION: CT HEAD: 1. No acute intracranial abnormality. 2. Advanced chronic microvascular ischemic changes of the white matter. CT CERVICAL SPINE: 1. No acute fracture or traumatic subluxation. 2. Chronic fracture of the base of the odontoid process with mild anterior subluxation of the dens and advanced arthritic changes of the lateral atlantoaxial joints. 3. 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 Electronically Signed   By: Keane Police D.O.   On: 11/07/2021 00:08   CT Cervical Spine Wo Contrast  Result Date: 11/07/2021 CLINICAL DATA:  Worsening paresthesias for 2 months. EXAM: CT HEAD WITHOUT CONTRAST CT CERVICAL SPINE WITHOUT CONTRAST TECHNIQUE: Multidetector CT imaging of the head and cervical spine was performed following the standard protocol without intravenous contrast. Multiplanar CT image reconstructions of the cervical spine were also generated.  RADIATION DOSE REDUCTION: This exam was performed according to the departmental dose-optimization program which includes automated exposure control, adjustment of the mA and/or kV according to patient size and/or use of iterative reconstruction technique. COMPARISON:  None Available. FINDINGS: CT HEAD FINDINGS Brain: No evidence of acute infarction, hemorrhage, hydrocephalus, extra-axial collection or mass lesion/mass effect. Multiple areas of low-attenuation in the periventricular and subcortical white matter presumed advanced chronic microvascular ischemic changes. Vascular: No hyperdense vessel or unexpected calcification. Skull: Normal. Negative for fracture or focal lesion. Sinuses/Orbits: No acute finding.  Bilateral lens extraction. Other: None CT CERVICAL SPINE FINDINGS Alignment: Straightening of the cervical spine and mild dextroscoliosis. Skull base and vertebrae: There is osseous remodeling of the dense mild anterior subluxation of the dens. There is a chronic fracture at the base of the odontoid process with advanced arthritic changes of the lateral atlantoaxial joints. Disc height loss and interbody fusion of the C4-C5. Soft tissues and spinal canal: No prevertebral fluid or swelling. No visible canal hematoma. Disc levels: C2-C3: Mild bilateral facet joint arthropathy. No significant spinal canal or neural foraminal stenosis. C3-C4: Disc height loss with mild spinal canal and moderate bilateral neural foraminal stenosis. Moderate facet joint  arthropathy. C4-C5: Near complete disc height loss. Interbody fusion. Moderate facet joint arthropathy. Mild left and moderate right neural foraminal stenosis. C5-C6: Mild subluxation of the C5. Disc height loss and mild spinal canal stenosis. Moderate left and severe right neural foraminal stenosis. C6-C7: Disc height loss with mild bilateral neural foraminal stenosis. C7-T1:  No significant finding. Upper chest: Atherosclerotic disease of thoracic aorta.  Biapical pleural/parenchymal scarring. No acute abnormality. Other: None IMPRESSION: CT HEAD: 1. No acute intracranial abnormality. 2. Advanced chronic microvascular ischemic changes of the white matter. CT CERVICAL SPINE: 1. No acute fracture or traumatic subluxation. 2. Chronic fracture of the base of the odontoid process with mild anterior subluxation of the dens and advanced arthritic changes of the lateral atlantoaxial joints. 3. 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 Electronically Signed   By: Keane Police D.O.   On: 11/07/2021 00:08    PROCEDURES and INTERVENTIONS:  Procedures  Medications - No data to display   IMPRESSION / MDM / [place] / ED COURSE  I reviewed the triage vital signs and the nursing notes.  Differential diagnosis includes, but is not limited to, spinal stenosis, cervical fracture, intracranial hemorrhage, stroke, peripheral Neuropathy  {Patient presents with symptoms of an acute illness or injury that is potentially life-threatening.  81 year old woman on anticoagulation presents with chronic paresthesias to her bilateral hands.  Look systemically with me with reassuring examination without evidence of acute neurologic or vascular deficits.  No signs of trauma.  Nontender back without evidence of spinal fracture.  Considering her anticoagulation and age, we will CT her head and neck.  Anticipate she will be suitable for outpatient management with her spine surgery follow-up and PCP work-up as scheduled, which I think is appropriate. I considered serum work-up and observation admission for this patient, but considering her multiple recent work-ups for the same and no serum abnormalities acutely, I decided not to pursue this.     FINAL CLINICAL IMPRESSION(S) / ED DIAGNOSES   Final diagnoses:  Paresthesia     Rx / DC Orders   ED Discharge Orders     None        Note:  This document was  prepared using Dragon voice recognition software and may include unintentional dictation errors.   Vladimir Crofts, MD 11/07/21 [phone]

## 2021-11-06 NOTE — Discharge Instructions (Addendum)
No bleeding or acute problems on the CT scans of your neck or head. Please follow up with your PCP, the spine clinic and get the MRI that they have scheduled.

## 2021-11-06 NOTE — ED Triage Notes (Signed)
Pt with worsening paresthesias x 2 months.  States she has been seen for this but has not received any diagnosis and "it scared me tonight"

## 2021-11-06 NOTE — ED Notes (Signed)
First Nurse note: Pt comes via EMS due to numbness in her hands and legs since April of this year and things are "getting worse". Pt was ambulatory with EMS.  VS 166/94 79 HR 98% on RA

## 2021-11-07 NOTE — ED Notes (Signed)
E-signature pad unavailable - Pt verbalized understanding of D/C information - no additional concerns at this time.  

## 2022-10-24 ENCOUNTER — Emergency Department
Admission: EM | Admit: 2022-10-24 | Discharge: 2022-10-24 | Disposition: A | Discharge status: Home / Self Care | Payer: Medicare Other | Attending: Emergency Medicine | Admitting: Emergency Medicine

## 2022-10-24 ENCOUNTER — Encounter: Payer: Self-pay | Admitting: Intensive Care

## 2022-10-24 ENCOUNTER — Other Ambulatory Visit: Payer: Self-pay

## 2022-10-24 DIAGNOSIS — L03116 Cellulitis of left lower limb: Secondary | ICD-10-CM

## 2022-10-24 DIAGNOSIS — M7989 Other specified soft tissue disorders: Secondary | ICD-10-CM | POA: Diagnosis present

## 2022-10-24 DIAGNOSIS — R600 Localized edema: Secondary | ICD-10-CM

## 2022-10-24 LAB — COMPREHENSIVE METABOLIC PANEL
ALT: 12 U/L (ref 0–44)
AST: 21 U/L (ref 15–41)
Albumin: 4.2 g/dL (ref 3.5–5.0)
Alkaline Phosphatase: 78 U/L (ref 38–126)
Anion gap: 12 (ref 5–15)
BUN: 33 mg/dL — ABNORMAL HIGH (ref 8–23)
CO2: 26 mmol/L (ref 22–32)
Calcium: 9.4 mg/dL (ref 8.9–10.3)
Chloride: 97 mmol/L — ABNORMAL LOW (ref 98–111)
Creatinine, Ser: 0.71 mg/dL (ref 0.44–1.00)
GFR, Estimated: >60 mL/min (ref >60)
Glucose, Bld: 100 mg/dL — ABNORMAL HIGH (ref 70–99)
Potassium: 4.3 mmol/L (ref 3.5–5.1)
Sodium: 135 mmol/L (ref 135–145)
Total Bilirubin: 1.3 mg/dL — ABNORMAL HIGH (ref 0.3–1.2)
Total Protein: 7.3 g/dL (ref 6.5–8.1)

## 2022-10-24 LAB — CBC WITH DIFFERENTIAL/PLATELET
Abs Immature Granulocytes: 0.01 10*3/uL (ref 0.00–0.07)
Basophils Absolute: 0.1 10*3/uL (ref 0.0–0.1)
Basophils Relative: 1 %
Eosinophils Absolute: 0.3 10*3/uL (ref 0.0–0.5)
Eosinophils Relative: 5 %
HCT: 35.5 % — ABNORMAL LOW (ref 36.0–46.0)
Hemoglobin: 10.8 g/dL — ABNORMAL LOW (ref 12.0–15.0)
Immature Granulocytes: 0 %
Lymphocytes Relative: 23 %
Lymphs Abs: 1.3 10*3/uL (ref 0.7–4.0)
MCH: 25.2 pg — ABNORMAL LOW (ref 26.0–34.0)
MCHC: 30.4 g/dL (ref 30.0–36.0)
MCV: 82.9 fL (ref 80.0–100.0)
Monocytes Absolute: 0.7 10*3/uL (ref 0.1–1.0)
Monocytes Relative: 11 %
Neutro Abs: 3.5 10*3/uL (ref 1.7–7.7)
Neutrophils Relative %: 60 %
Platelets: 253 10*3/uL (ref 150–400)
RBC: 4.28 MIL/uL (ref 3.87–5.11)
RDW: 16.2 % — ABNORMAL HIGH (ref 11.5–15.5)
WBC: 5.8 10*3/uL (ref 4.0–10.5)
nRBC: 0 % (ref 0.0–0.2)

## 2022-10-24 MED ORDER — CEPHALEXIN 500 MG PO CAPS: 500.0000 mg | ORAL_CAPSULE | Freq: Four times a day (QID) | ORAL | 0 refills | Status: AC

## 2022-10-24 MED ORDER — BACITRACIN ZINC 500 UNIT/GM EX OINT
TOPICAL_OINTMENT | Freq: Once | CUTANEOUS | Status: AC
  Administered 2022-10-24: 1 via TOPICAL
  Filled 2022-10-24: qty 0.9

## 2022-10-24 NOTE — ED Provider Notes (Signed)
[facility] Provider Note    Event Date/Time   First MD Initiated Contact with Patient 10/24/22 1952     (approximate)   History   Wound Infection   HPI  Lydia Soto is a 82 y.o. female with a history of hyperlipidemia, atrial fibrillation, hypertrophic cardiomyopathy, OSA, and anemia who presents with bilateral leg swelling, blisters, and a wound to the left leg.  The patient states that she has had chronic swelling that was treated with diuretics.  She has developed some blisters over the last few weeks and has 1 on the left leg which opened up.  She denies any fever or chills.  She has no redness spreading up the legs.  She denies any acute pain.  I reviewed the past medical records.  The patient was seen in the ED on 10/24 chronic hand paresthesias and was seen at [facility] urgent care on 10/16 for fatigue as well as the tingling.  Previously her most recent outpatient encounter was with internal medicine on 9/5 for follow-up of worsening leg swelling after treatment with Lasix.   Physical Exam   Triage Vital Signs: ED Triage Vitals  Encounter Vitals Group     BP 10/24/22 1730 139/80     Systolic BP Percentile --      Diastolic BP Percentile --      Pulse Rate 10/24/22 1730 91     Resp 10/24/22 1730 16     Temp 10/24/22 1730 98.1 F (36.7 C)     Temp Source 10/24/22 1730 Oral     SpO2 10/24/22 1730 97 %     Weight 10/24/22 1731 170 lb (77.1 kg)     Height 10/24/22 1731 5\' 4"  (1.626 m)     Head Circumference --      Peak Flow --      Pain Score 10/24/22 1731 0     Pain Loc --      Pain Education --      Exclude from Growth Chart --     Most recent vital signs: Vitals:   10/24/22 1730  BP: 139/80  Pulse: 91  Resp: 16  Temp: 98.1 F (36.7 C)  SpO2: 97%     General: Awake, no distress.  CV:  Good peripheral perfusion.  Resp:  Normal effort.  Abd:  No distention.  Other:  Bilateral lower legs with 1+ pitting edema.  Faint erythema.   Left anterior lower leg appears somewhat indurated and is warmer than surrounding tissue.  There is a 3 cm superficial open blister with granulation tissue.  There is no bleeding or drainage.  There are other scattered sub-1 cm intact blisters to both legs.  2+ DP pulses bilaterally.  Normal cap refill distally.   ED Results / Procedures / Treatments   Labs (all labs ordered are listed, but only abnormal results are displayed) Labs Reviewed  CBC WITH DIFFERENTIAL/PLATELET - Abnormal; Notable for the following components:      Result Value   Hemoglobin 10.8 (*)    HCT 35.5 (*)    MCH 25.2 (*)    RDW 16.2 (*)    All other components within normal limits  COMPREHENSIVE METABOLIC PANEL - Abnormal; Notable for the following components:   Chloride 97 (*)    Glucose, Bld 100 (*)    BUN 33 (*)    Total Bilirubin 1.3 (*)    All other components within normal limits     EKG  RADIOLOGY    PROCEDURES:  Critical Care performed: No  Procedures   MEDICATIONS ORDERED IN ED: Medications  bacitracin ointment (1 Application Topical Given 10/24/22 2024)     IMPRESSION / MDM / ASSESSMENT AND PLAN / ED COURSE  I reviewed the triage vital signs and the nursing notes.  82 year old female with PMH as noted above presents with subacute to chronic bilateral lower extremity edema which has recently improved after treatment with diuretics, but the patient now reports blisters bilaterally and some redness to the lower legs.  On exam there is faint erythema to both legs although more induration and warmth to the anterior left leg, and a superficial wound from an open blister with no evidence of wound infection.  The patient states she is not diabetic.  Vital signs are normal.  Differential diagnosis includes, but is not limited to, peripheral edema, cellulitis.  There is no evidence of wound infection.  There is no evidence of deep soft tissue infection.  Both lower extremities are  neuro/vascular intact.  The left leg in particular appears to have mild cellulitis.  Patient's presentation is most consistent with acute complicated illness / injury requiring diagnostic workup.  Lab workup is unremarkable.  There is no leukocytosis on the CBC.  Electrolytes are unremarkable.  There is no indication for imaging.  At this time, the patient is well-appearing and stable for discharge home.  I applied bacitracin and dressed the wound to the left leg.  I will start the patient on a course of Keflex and recommended that she apply antibacterial ointment to any open areas and keep them clean and dry.  She will follow-up with her primary care provider at [facility].  I gave strict return precautions and she expressed understanding.   FINAL CLINICAL IMPRESSION(S) / ED DIAGNOSES   Final diagnoses:  Lower extremity edema  Cellulitis of left leg     Rx / DC Orders   ED Discharge Orders          Ordered    cephALEXin (KEFLEX) 500 MG capsule  4 times daily        10/24/22 2025             Note:  This document was prepared using Dragon voice recognition software and may include unintentional dictation errors.    Dionne Bucy, MD 10/24/22 2033

## 2022-10-24 NOTE — ED Triage Notes (Signed)
Patient presents with bilateral leg swelling and large wound on left shin and multiple fluid filled blisters on right lower leg. Redness present on both lower legs

## 2022-10-24 NOTE — Discharge Instructions (Signed)
The cephalexin as prescribed and finish the full 7-day course.  You should apply Neosporin or bacitracin ointment at least once daily to the wound on your left leg and to any other open blister areas.  Clean the legs with soap and water daily in the shower and keep them clean and dry.  Follow-up with your primary care provider in the next 1 to 2 weeks.  Return to the ER immediately for new, worsening, or persistent severe wounds, bleeding, pus drainage, redness, rash or swelling spreading up the leg, fever or chills, or any other new or worsening symptoms that concern you.

## 2023-02-24 ENCOUNTER — Encounter: Payer: Medicare Other | Attending: Physician Assistant | Admitting: Physician Assistant

## 2023-02-24 DIAGNOSIS — L2389 Allergic contact dermatitis due to other agents: Secondary | ICD-10-CM | POA: Diagnosis not present

## 2023-02-24 DIAGNOSIS — I480 Paroxysmal atrial fibrillation: Secondary | ICD-10-CM | POA: Insufficient documentation

## 2023-02-24 DIAGNOSIS — I890 Lymphedema, not elsewhere classified: Secondary | ICD-10-CM | POA: Insufficient documentation

## 2023-02-24 DIAGNOSIS — I87323 Chronic venous hypertension (idiopathic) with inflammation of bilateral lower extremity: Secondary | ICD-10-CM | POA: Insufficient documentation

## 2023-02-24 DIAGNOSIS — G4730 Sleep apnea, unspecified: Secondary | ICD-10-CM | POA: Diagnosis not present
